# Patient Record
Sex: Female | Born: 1980 | Race: Black or African American | Hispanic: No | Marital: Single | Smoking: Never smoker
Health system: Southern US, Community
[De-identification: ages and names within clinical notes are randomized; demographics above are authoritative.]

## PROBLEM LIST (undated history)

## (undated) ENCOUNTER — Inpatient Hospital Stay (HOSPITAL_COMMUNITY): Payer: Self-pay

## (undated) DIAGNOSIS — F419 Anxiety disorder, unspecified: Secondary | ICD-10-CM

## (undated) HISTORY — DX: Anxiety disorder, unspecified: F41.9

---

## 2003-09-23 ENCOUNTER — Other Ambulatory Visit: Admission: RE | Admit: 2003-09-23 | Discharge: 2003-09-23 | Payer: Self-pay | Admitting: Family Medicine

## 2004-11-09 ENCOUNTER — Other Ambulatory Visit: Admission: RE | Admit: 2004-11-09 | Discharge: 2004-11-09 | Payer: Self-pay | Admitting: Family Medicine

## 2006-05-14 HISTORY — PX: WISDOM TOOTH EXTRACTION: SHX21

## 2006-11-05 ENCOUNTER — Other Ambulatory Visit: Admission: RE | Admit: 2006-11-05 | Discharge: 2006-11-05 | Payer: Self-pay | Admitting: Family Medicine

## 2008-11-08 ENCOUNTER — Other Ambulatory Visit: Admission: RE | Admit: 2008-11-08 | Discharge: 2008-11-08 | Payer: Self-pay | Admitting: Family Medicine

## 2012-11-10 LAB — OB RESULTS CONSOLE PLATELET COUNT: Platelets: 235 10*3/uL

## 2012-11-10 LAB — OB RESULTS CONSOLE ANTIBODY SCREEN: Antibody Screen: NEGATIVE

## 2012-11-10 LAB — OB RESULTS CONSOLE RPR: RPR: NONREACTIVE

## 2012-11-10 LAB — OB RESULTS CONSOLE ABO/RH: RH Type: POSITIVE

## 2012-11-10 LAB — OB RESULTS CONSOLE HEPATITIS B SURFACE ANTIGEN: Hepatitis B Surface Ag: NEGATIVE

## 2012-11-10 LAB — OB RESULTS CONSOLE HGB/HCT, BLOOD: Hemoglobin: 12.5 g/dL

## 2013-02-18 ENCOUNTER — Telehealth: Payer: Self-pay | Admitting: General Practice

## 2013-02-18 ENCOUNTER — Encounter: Payer: Self-pay | Admitting: *Deleted

## 2013-02-18 NOTE — Telephone Encounter (Signed)
Patient called and left message stating she needs to speak with a nurse, she has an issue with the front desk in regards to scheduling. Called patient stating I was returning her phone call. Patient stated that she was seen at her doctors office in [place] on 9/24 and had an ultrasound done and was told she needed a follow up ultrasound in 2 weeks because they couldn't visualize the baby's heart well and her new appt with [place] isn't until the 30th and that appt isn't for an ultrasound. Told patient we could see her tomorrow for a new OB appt at 7:45 and she can mention this to the provider tomorrow now that we have her records and they can discuss with her if she needs another ultrasound. Patient verbalized understanding and stated she would be here. Patient informed if she couldn't make this appt she would have to wait till 10/30. Patient verbalized understanding to all and had no further questions

## 2013-02-19 ENCOUNTER — Ambulatory Visit (INDEPENDENT_AMBULATORY_CARE_PROVIDER_SITE_OTHER): Payer: Managed Care, Other (non HMO) | Admitting: Family Medicine

## 2013-02-19 ENCOUNTER — Encounter: Payer: Self-pay | Admitting: Family Medicine

## 2013-02-19 VITALS — BP 117/73 | Temp 97.0°F | Ht 68.0 in | Wt 282.1 lb

## 2013-02-19 DIAGNOSIS — Z23 Encounter for immunization: Secondary | ICD-10-CM

## 2013-02-19 DIAGNOSIS — Z3400 Encounter for supervision of normal first pregnancy, unspecified trimester: Secondary | ICD-10-CM | POA: Insufficient documentation

## 2013-02-19 DIAGNOSIS — Z3402 Encounter for supervision of normal first pregnancy, second trimester: Secondary | ICD-10-CM

## 2013-02-19 LAB — POCT URINALYSIS DIP (DEVICE)
Bilirubin Urine: NEGATIVE
Ketones, ur: NEGATIVE mg/dL
Leukocytes, UA: NEGATIVE
Nitrite: NEGATIVE
Protein, ur: NEGATIVE mg/dL

## 2013-02-19 NOTE — Addendum Note (Signed)
Addended by: Franchot Mimes on: 02/19/2013 02:07 PM   Modules accepted: Orders

## 2013-02-19 NOTE — Progress Notes (Signed)
U/S scheduled 02/26/13 at 8 am.

## 2013-02-19 NOTE — Progress Notes (Signed)
P=104, Here for first ob visit. Transferring from [facility]. Given new patient information. Discussed BMI/ appropriate weight gain.

## 2013-02-19 NOTE — Progress Notes (Signed)
New OB transfer from [place]--[facility] WNL including low risk first screen--needs to complete anatomy-flu shot today.

## 2013-02-19 NOTE — Patient Instructions (Signed)
Pregnancy - Second Trimester The second trimester of pregnancy (3 to 6 months) is a period of rapid growth for you and your baby. At the end of the sixth month, your baby is about 9 inches long and weighs 1 1/2 pounds. You will begin to feel the baby move between 18 and 20 weeks of the pregnancy. This is called quickening. Weight gain is faster. A clear fluid (colostrum) may leak out of your breasts. You may feel small contractions of the womb (uterus). This is known as false labor or Braxton-Hicks contractions. This is like a practice for labor when the baby is ready to be born. Usually, the problems with morning sickness have usually passed by the end of your first trimester. Some women develop small dark blotches (called cholasma, mask of pregnancy) on their face that usually goes away after the baby is born. Exposure to the sun makes the blotches worse. Acne may also develop in some pregnant women and pregnant women who have acne, may find that it goes away. PRENATAL EXAMS  Blood work may continue to be done during prenatal exams. These tests are done to check on your health and the probable health of your baby. Blood work is used to follow your blood levels (hemoglobin). Anemia (low hemoglobin) is common during pregnancy. Iron and vitamins are given to help prevent this. You will also be checked for diabetes between 24 and 28 weeks of the pregnancy. Some of the previous blood tests may be repeated.  The size of the uterus is measured during each visit. This is to make sure that the baby is continuing to grow properly according to the dates of the pregnancy.  Your blood pressure is checked every prenatal visit. This is to make sure you are not getting toxemia.  Your urine is checked to make sure you do not have an infection, diabetes or protein in the urine.  Your weight is checked often to make sure gains are happening at the suggested rate. This is to ensure that both you and your baby are  growing normally.  Sometimes, an ultrasound is performed to confirm the proper growth and development of the baby. This is a test which bounces harmless sound waves off the baby so your caregiver can more accurately determine due dates. Sometimes, a test is done on the amniotic fluid surrounding the baby. This test is called an amniocentesis. The amniotic fluid is obtained by sticking a needle into the belly (abdomen). This is done to check the chromosomes in instances where there is a concern about possible genetic problems with the baby. It is also sometimes done near the end of pregnancy if an early delivery is required. In this case, it is done to help make sure the baby's lungs are mature enough for the baby to live outside of the womb. CHANGES OCCURING IN THE SECOND TRIMESTER OF PREGNANCY Your body goes through many changes during pregnancy. They vary from person to person. Talk to your caregiver about changes you notice that you are concerned about.  During the second trimester, you will likely have an increase in your appetite. It is normal to have cravings for certain foods. This varies from person to person and pregnancy to pregnancy.  Your lower abdomen will begin to bulge.  You may have to urinate more often because the uterus and baby are pressing on your bladder. It is also common to get more bladder infections during pregnancy. You can help this by drinking lots of fluids   and emptying your bladder before and after intercourse.  You may begin to get stretch marks on your hips, abdomen, and breasts. These are normal changes in the body during pregnancy. There are no exercises or medicines to take that prevent this change.  You may begin to develop swollen and bulging veins (varicose veins) in your legs. Wearing support hose, elevating your feet for 15 minutes, 3 to 4 times a day and limiting salt in your diet helps lessen the problem.  Heartburn may develop as the uterus grows and  pushes up against the stomach. Antacids recommended by your caregiver helps with this problem. Also, eating smaller meals 4 to 5 times a day helps.  Constipation can be treated with a stool softener or adding bulk to your diet. Drinking lots of fluids, and eating vegetables, fruits, and whole grains are helpful.  Exercising is also helpful. If you have been very active up until your pregnancy, most of these activities can be continued during your pregnancy. If you have been less active, it is helpful to start an exercise program such as walking.  Hemorrhoids may develop at the end of the second trimester. Warm sitz baths and hemorrhoid cream recommended by your caregiver helps hemorrhoid problems.  Backaches may develop during this time of your pregnancy. Avoid heavy lifting, wear low heal shoes, and practice good posture to help with backache problems.  Some pregnant women develop tingling and numbness of their hand and fingers because of swelling and tightening of ligaments in the wrist (carpel tunnel syndrome). This goes away after the baby is born.  As your breasts enlarge, you may have to get a bigger bra. Get a comfortable, cotton, support bra. Do not get a nursing bra until the last month of the pregnancy if you will be nursing the baby.  You may get a dark line from your belly button to the pubic area called the linea nigra.  You may develop rosy cheeks because of increase blood flow to the face.  You may develop spider looking lines of the face, neck, arms, and chest. These go away after the baby is born. HOME CARE INSTRUCTIONS   It is extremely important to avoid all smoking, herbs, alcohol, and unprescribed drugs during your pregnancy. These chemicals affect the formation and growth of the baby. Avoid these chemicals throughout the pregnancy to ensure the delivery of a healthy infant.  Most of your home care instructions are the same as suggested for the first trimester of your  pregnancy. Keep your caregiver's appointments. Follow your caregiver's instructions regarding medicine use, exercise, and diet.  During pregnancy, you are providing food for you and your baby. Continue to eat regular, well-balanced meals. Choose foods such as meat, fish, milk and other low fat dairy products, vegetables, fruits, and whole-grain breads and cereals. Your caregiver will tell you of the ideal weight gain.  A physical sexual relationship may be continued up until near the end of pregnancy if there are no other problems. Problems could include early (premature) leaking of amniotic fluid from the membranes, vaginal bleeding, abdominal pain, or other medical or pregnancy problems.  Exercise regularly if there are no restrictions. Check with your caregiver if you are unsure of the safety of some of your exercises. The greatest weight gain will occur in the last 2 trimesters of pregnancy. Exercise will help you:  Control your weight.  Get you in shape for labor and delivery.  Lose weight after you have the baby.  Wear   a good support or jogging bra for breast tenderness during pregnancy. This may help if worn during sleep. Pads or tissues may be used in the bra if you are leaking colostrum.  Do not use hot tubs, steam rooms or saunas throughout the pregnancy.  Wear your seat belt at all times when driving. This protects you and your baby if you are in an accident.  Avoid raw meat, uncooked cheese, cat litter boxes, and soil used by cats. These carry germs that can cause birth defects in the baby.  The second trimester is also a good time to visit your dentist for your dental health if this has not been done yet. Getting your teeth cleaned is okay. Use a soft toothbrush. Brush gently during pregnancy.  It is easier to leak urine during pregnancy. Tightening up and strengthening the pelvic muscles will help with this problem. Practice stopping your urination while you are going to the  bathroom. These are the same muscles you need to strengthen. It is also the muscles you would use as if you were trying to stop from passing gas. You can practice tightening these muscles up 10 times a set and repeating this about 3 times per day. Once you know what muscles to tighten up, do not perform these exercises during urination. It is more likely to contribute to an infection by backing up the urine.  Ask for help if you have financial, counseling, or nutritional needs during pregnancy. Your caregiver will be able to offer counseling for these needs as well as refer you for other special needs.  Your skin may become oily. If so, wash your face with mild soap, use non-greasy moisturizer and oil or cream based makeup. MEDICINES AND DRUG USE IN PREGNANCY  Take prenatal vitamins as directed. The vitamin should contain 1 milligram of folic acid. Keep all vitamins out of reach of children. Only a couple vitamins or tablets containing iron may be fatal to a baby or young child when ingested.  Avoid use of all medicines, including herbs, over-the-counter medicines, not prescribed or suggested by your caregiver. Only take over-the-counter or prescription medicines for pain, discomfort, or fever as directed by your caregiver. Do not use aspirin.  Let your caregiver also know about herbs you may be using.  Alcohol is related to a number of birth defects. This includes fetal alcohol syndrome. All alcohol, in any form, should be avoided completely. Smoking will cause low birth rate and premature babies.  Street or illegal drugs are very harmful to the baby. They are absolutely forbidden. A baby born to an addicted mother will be addicted at birth. The baby will go through the same withdrawal an adult does. SEEK MEDICAL CARE IF:  You have any concerns or worries during your pregnancy. It is better to call with your questions if you feel they cannot wait, rather than worry about them. SEEK IMMEDIATE  MEDICAL CARE IF:   An unexplained oral temperature above 102 F (38.9 C) develops, or as your caregiver suggests.  You have leaking of fluid from the vagina (birth canal). If leaking membranes are suspected, take your temperature and tell your caregiver of this when you call.  There is vaginal spotting, bleeding, or passing clots. Tell your caregiver of the amount and how many pads are used. Light spotting in pregnancy is common, especially following intercourse.  You develop a bad smelling vaginal discharge with a change in the color from clear to white.  You continue to feel   sick to your stomach (nauseated) and have no relief from remedies suggested. You vomit blood or coffee ground-like materials.  You lose more than 2 pounds of weight or gain more than 2 pounds of weight over 1 week, or as suggested by your caregiver.  You notice swelling of your face, hands, feet, or legs.  You get exposed to German measles and have never had them.  You are exposed to fifth disease or chickenpox.  You develop belly (abdominal) pain. Round ligament discomfort is a common non-cancerous (benign) cause of abdominal pain in pregnancy. Your caregiver still must evaluate you.  You develop a bad headache that does not go away.  You develop fever, diarrhea, pain with urination, or shortness of breath.  You develop visual problems, blurry, or double vision.  You fall or are in a car accident or any kind of trauma.  There is mental or physical violence at home. Document Released: 04/24/2001 Document Revised: 01/23/2012 Document Reviewed: 10/27/2008 [employer] Patient Information 2014 [employer].  Breastfeeding A change in hormones during your pregnancy causes growth of your breast tissue and an increase in number and size of milk ducts. The hormone prolactin allows proteins, sugars, and fats from your blood supply to make breast milk in your milk-producing glands. The hormone progesterone prevents  breast milk from being released before the birth of your baby. After the birth of your baby, your progesterone level decreases allowing breast milk to be released. Thoughts of your baby, as well as his or her sucking or crying, can stimulate the release of milk from the milk-producing glands. Deciding to breastfeed (nurse) is one of the best choices you can make for you and your baby. The information that follows gives a brief review of the benefits, as well as other important skills to know about breastfeeding. BENEFITS OF BREASTFEEDING For your baby  The first milk (colostrum) helps your baby's digestive system function better.   There are antibodies in your milk that help your baby fight off infections.   Your baby has a lower incidence of asthma, allergies, and sudden infant death syndrome (SIDS).   The nutrients in breast milk are better for your baby than infant formulas.  Breast milk improves your baby's brain development.   Your baby will have less gas, colic, and constipation.  Your baby is less likely to develop other conditions, such as childhood obesity, asthma, or diabetes mellitus. For you  Breastfeeding helps develop a very special bond between you and your baby.   Breastfeeding is convenient, always available at the correct temperature, and costs nothing.   Breastfeeding helps to burn calories and helps you lose the weight gained during pregnancy.   Breastfeeding makes your uterus contract back down to normal size faster and slows bleeding following delivery.   Breastfeeding mothers have a lower risk of developing osteoporosis or breast or ovarian cancer later in life.  BREASTFEEDING FREQUENCY  A healthy, full-term baby may breastfeed as often as every hour or space his or her feedings to every 3 hours. Breastfeeding frequency will vary from baby to baby.   Newborns should be fed no less than every 2 3 hours during the day and every 4 5 hours during the  night. You should breastfeed a minimum of 8 feedings in a 24 hour period.  Awaken your baby to breastfeed if it has been 3 4 hours since the last feeding.  Breastfeed when you feel the need to reduce the fullness of your breasts or when   your newborn shows signs of hunger. Signs that your baby may be hungry include:  Increased alertness or activity.  Stretching.  Movement of the head from side to side.  Movement of the head and opening of the mouth when the corner of the mouth or cheek is stroked (rooting).  Increased sucking sounds, smacking lips, cooing, sighing, or squeaking.  Hand-to-mouth movements.  Increased sucking of fingers or hands.  Fussing.  Intermittent crying.  Signs of extreme hunger will require calming and consoling before you try to feed your baby. Signs of extreme hunger may include:  Restlessness.  A loud, strong cry.  Screaming.  Frequent feeding will help you make more milk and will help prevent problems, such as sore nipples and engorgement of the breasts.  BREASTFEEDING   Whether lying down or sitting, be sure that the baby's abdomen is facing your abdomen.   Support your breast with 4 fingers under your breast and your thumb above your nipple. Make sure your fingers are well away from your nipple and your baby's mouth.   Stroke your baby's lips gently with your finger or nipple.   When your baby's mouth is open wide enough, place all of your nipple and as much of the colored area around your nipple (areola) as possible into your baby's mouth.  More areola should be visible above his or her upper lip than below his or her lower lip.  Your baby's tongue should be between his or her lower gum and your breast.  Ensure that your baby's mouth is correctly positioned around the nipple (latched). Your baby's lips should create a seal on your breast.  Signs that your baby has effectively latched onto your nipple include:  Tugging or sucking  without pain.  Swallowing heard between sucks.  Absent click or smacking sound.  Muscle movement above and in front of his or her ears with sucking.  Your baby must suck about 2 3 minutes in order to get your milk. Allow your baby to feed on each breast as long as he or she wants. Nurse your baby until he or she unlatches or falls asleep at the first breast, then offer the second breast.  Signs that your baby is full and satisfied include:  A gradual decrease in the number of sucks or complete cessation of sucking.  Falling asleep.  Extension or relaxation of his or her body.  Retention of a small amount of milk in his or her mouth.  Letting go of your breast by himself or herself.  Signs of effective breastfeeding in you include:  Breasts that have increased firmness, weight, and size prior to feeding.  Breasts that are softer after nursing.  Increased milk volume, as well as a change in milk consistency and color by the 5th day of breastfeeding.  Breast fullness relieved by breastfeeding.  Nipples are not sore, cracked, or bleeding.  If needed, break the suction by putting your finger into the corner of your baby's mouth and sliding your finger between his or her gums. Then, remove your breast from his or her mouth.  It is common for babies to spit up a small amount after a feeding.  Babies often swallow air during feeding. This can make babies fussy. Burping your baby between breasts can help with this.  Vitamin D supplements are recommended for babies who get only breast milk.  Avoid using a pacifier during your baby's first 4 6 weeks.  Avoid supplemental feedings of water, formula, or   juice in place of breastfeeding. Breast milk is all the food your baby needs. It is not necessary for your baby to have water or formula. Your breasts will make more milk if supplemental feedings are avoided during the early weeks. HOW TO TELL WHETHER YOUR BABY IS GETTING ENOUGH BREAST  MILK Wondering whether or not your baby is getting enough milk is a common concern among mothers. You can be assured that your baby is getting enough milk if:   Your baby is actively sucking and you hear swallowing.   Your baby seems relaxed and satisfied after a feeding.   Your baby nurses at least 8 12 times in a 24 hour time period.  During the first 3 5 days of age:  Your baby is wetting at least 3 5 diapers in a 24 hour period. The urine should be clear and pale yellow.  Your baby is having at least 3 4 stools in a 24 hour period. The stool should be soft and yellow.  At 5 7 days of age, your baby is having at least 3 6 stools in a 24 hour period. The stool should be seedy and yellow by 5 days of age.  Your baby has a weight loss less than 7 10% during the first 3 days of age.  Your baby does not lose weight after 3 7 days of age.  Your baby gains 4 7 ounces each week after he or she is 4 days of age.  Your baby gains weight by 5 days of age and is back to birth weight within 2 weeks. ENGORGEMENT In the first week after your baby is born, you may experience extremely full breasts (engorgement). When engorged, your breasts may feel heavy, warm, or tender to the touch. Engorgement peaks within 24 48 hours after delivery of your baby.  Engorgement may be reduced by:  Continuing to breastfeed.  Increasing the frequency of breastfeeding.  Taking warm showers or applying warm, moist heat to your breasts just before each feeding. This increases circulation and helps the milk flow.   Gently massaging your breast before and during the feedings. With your fingertips, massage from your chest wall towards your nipple in a circular motion.   Ensuring that your baby empties at least one breast at every feeding. It also helps to start the next feeding on the opposite breast.   Expressing breast milk by hand or by using a breast pump to empty the breasts if your baby is sleepy, or  not nursing well. You may also want to express milk if you are returning to work oryou feel you are getting engorged.  Ensuring your baby is latched on and positioned properly while breastfeeding. If you follow these suggestions, your engorgement should improve in 24 48 hours. If you are still experiencing difficulty, call your lactation consultant or caregiver.  CARING FOR YOURSELF Take care of your breasts.  Bathe or shower daily.   Avoid using soap on your nipples.   Wear a supportive bra. Avoid wearing underwire style bras.  Air dry your nipples for a 3 4minutes after each feeding.   Use only cotton bra pads to absorb breast milk leakage. Leaking of breast milk between feedings is normal.   Use only pure lanolin on your nipples after nursing. You do not need to wash it off before feeding your baby again. Another option is to express a few drops of breast milk and gently massage that milk into your nipples.  Continue   breast self-awareness checks. Take care of yourself.  Eat healthy foods. Alternate 3 meals with 3 snacks.  Avoid foods that you notice affect your baby in a bad way.  Drink milk, fruit juice, and water to satisfy your thirst (about 8 glasses a day).   Rest often, relax, and take your prenatal vitamins to prevent fatigue, stress, and anemia.  Avoid chewing and smoking tobacco.  Avoid alcohol and drug use.  Take over-the-counter and prescribed medicine only as directed by your caregiver or pharmacist. You should always check with your caregiver or pharmacist before taking any new medicine, vitamin, or herbal supplement.  Know that pregnancy is possible while breastfeeding. If desired, talk to your caregiver about family planning and safe birth control methods that may be used while breastfeeding. SEEK MEDICAL CARE IF:   You feel like you want to stop breastfeeding or have become frustrated with breastfeeding.  You have painful breasts or nipples.  Your  nipples are cracked or bleeding.  Your breasts are red, tender, or warm.  You have a swollen area on either breast.  You have a fever or chills.  You have nausea or vomiting.  You have drainage from your nipples.  Your breasts do not become full before feedings by the 5th day after delivery.  You feel sad and depressed.  Your baby is too sleepy to eat well.  Your baby is having trouble sleeping.   Your baby is wetting less than 3 diapers in a 24 hour period.  Your baby has less than 3 stools in a 24 hour period.  Your baby's skin or the white part of his or her eyes becomes more yellow.   Your baby is not gaining weight by 5 days of age. MAKE SURE YOU:   Understand these instructions.  Will watch your condition.  Will get help right away if you are not doing well or get worse. Document Released: 04/30/2005 Document Revised: 01/23/2012 Document Reviewed: 12/05/2011 [employer] Patient Information 2014 [employer].  

## 2013-02-20 LAB — PRESCRIPTION MONITORING PROFILE (19 PANEL)
Amphetamine/Meth: NEGATIVE ng/mL
Buprenorphine, Urine: NEGATIVE ng/mL
Cannabinoid Scrn, Ur: NEGATIVE ng/mL
Carisoprodol, Urine: NEGATIVE ng/mL
Cocaine Metabolites: NEGATIVE ng/mL
Creatinine, Urine: 139.31 mg/dL (ref >20.0)
MDMA URINE: NEGATIVE ng/mL
Meperidine, Ur: NEGATIVE ng/mL
Methadone Screen, Urine: NEGATIVE ng/mL
Methaqualone: NEGATIVE ng/mL
Nitrites, Initial: NEGATIVE ug/mL
Oxycodone Screen, Ur: NEGATIVE ng/mL
Phencyclidine, Ur: NEGATIVE ng/mL
Propoxyphene: NEGATIVE ng/mL
Tapentadol, urine: NEGATIVE ng/mL
Tramadol Scrn, Ur: NEGATIVE ng/mL

## 2013-02-20 LAB — ALCOHOL METABOLITE (ETG), URINE: Ethyl Glucuronide (EtG): NEGATIVE ng/mL

## 2013-02-26 ENCOUNTER — Ambulatory Visit (HOSPITAL_COMMUNITY): Admission: RE | Admit: 2013-02-26 | Payer: Managed Care, Other (non HMO) | Source: Ambulatory Visit

## 2013-02-26 ENCOUNTER — Other Ambulatory Visit: Payer: Self-pay | Admitting: Family Medicine

## 2013-02-26 ENCOUNTER — Ambulatory Visit (HOSPITAL_COMMUNITY)
Admission: RE | Admit: 2013-02-26 | Discharge: 2013-02-26 | Disposition: A | Discharge status: Home / Self Care | Payer: Managed Care, Other (non HMO) | Source: Ambulatory Visit | Attending: Family Medicine | Admitting: Family Medicine

## 2013-02-26 DIAGNOSIS — Z3402 Encounter for supervision of normal first pregnancy, second trimester: Secondary | ICD-10-CM

## 2013-02-26 DIAGNOSIS — Z363 Encounter for antenatal screening for malformations: Secondary | ICD-10-CM | POA: Insufficient documentation

## 2013-02-26 DIAGNOSIS — E669 Obesity, unspecified: Secondary | ICD-10-CM | POA: Insufficient documentation

## 2013-02-26 DIAGNOSIS — O358XX0 Maternal care for other (suspected) fetal abnormality and damage, not applicable or unspecified: Secondary | ICD-10-CM | POA: Insufficient documentation

## 2013-02-26 DIAGNOSIS — Z1389 Encounter for screening for other disorder: Secondary | ICD-10-CM | POA: Insufficient documentation

## 2013-02-27 ENCOUNTER — Encounter: Payer: Self-pay | Admitting: Family Medicine

## 2013-03-04 ENCOUNTER — Encounter: Payer: Self-pay | Admitting: *Deleted

## 2013-03-04 DIAGNOSIS — O99340 Other mental disorders complicating pregnancy, unspecified trimester: Secondary | ICD-10-CM | POA: Insufficient documentation

## 2013-03-12 ENCOUNTER — Encounter: Payer: Self-pay | Admitting: Obstetrics & Gynecology

## 2013-03-20 ENCOUNTER — Ambulatory Visit (INDEPENDENT_AMBULATORY_CARE_PROVIDER_SITE_OTHER): Payer: 59 | Admitting: Family Medicine

## 2013-03-20 ENCOUNTER — Encounter: Payer: Self-pay | Admitting: Family Medicine

## 2013-03-20 VITALS — Temp 98.2°F

## 2013-03-20 DIAGNOSIS — Z3402 Encounter for supervision of normal first pregnancy, second trimester: Secondary | ICD-10-CM

## 2013-03-20 DIAGNOSIS — O99340 Other mental disorders complicating pregnancy, unspecified trimester: Secondary | ICD-10-CM

## 2013-03-20 LAB — POCT URINALYSIS DIP (DEVICE)
Bilirubin Urine: NEGATIVE
Hgb urine dipstick: NEGATIVE
Leukocytes, UA: NEGATIVE
Nitrite: NEGATIVE
Urobilinogen, UA: 0.2 mg/dL (ref 0.0–1.0)
pH: 6 (ref 5.0–8.0)

## 2013-03-20 LAB — CBC
HCT: 32.5 % — ABNORMAL LOW (ref 36.0–46.0)
Hemoglobin: 10.9 g/dL — ABNORMAL LOW (ref 12.0–15.0)
MCH: 27.6 pg (ref 26.0–34.0)
MCHC: 33.5 g/dL (ref 30.0–36.0)
MCV: 82.3 fL (ref 78.0–100.0)
RBC: 3.95 MIL/uL (ref 3.87–5.11)
WBC: 9 10*3/uL (ref 4.0–10.5)

## 2013-03-20 MED ORDER — BREAST PUMP MISC: 1.0000 [IU] | Status: DC

## 2013-03-20 NOTE — Progress Notes (Signed)
[name] is a 32 y.o. G1P0 at [redacted]w[redacted]d here for ROB visit. No VB, no ctx, no lof, normal fm  Round ligament pain at night. mild  Discussed with Patient:  - Plans to breast/ feed.  All questions answered. - Continue prenatal vitamins. - Reviewed fetal kick counts (Pt to perform daily at a time when the baby is active, lie laterally with both hands on belly in quiet room and count all movements (hiccups, shoulder rolls, obvious kicks, etc); pt is to report to clinic or MAU for less than 10 movements felt in a one hour time period-pt told as soon as she counts 10 movements the count is complete.)  - Routine precautions discussed (depression, infection s/s).   Patient provided with all pertinent phone numbers for emergencies. - RTC for any VB, regular, painful cramps/ctxs occurring at a rate of >2/10 min, fever (100.5 or higher), n/v/d, any pain that is unresolving or worsening, LOF, decreased fetal movement, CP, SOB, edema  Problems: Patient Active Problem List   Diagnosis Date Noted  . 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 Supervision of normal first pregnancy 02/19/2013    To Do: 1. Glucose tolerance test ordered.  Patient will draw in clinic.  Will f/u test and amend plan based on results. 2. CBC and antibody screen ordered. 3.   [ ]  Vaccines: Flu: recd Tdap:  [ ]  BCM: undecided  Edu: [x ] PTL precautions; [ ]  BF class; [ ]  childbirth class; [ ]   BF counseling;

## 2013-03-20 NOTE — Addendum Note (Signed)
Addended by: Franchot Mimes on: 03/20/2013 10:54 AM   Modules accepted: Orders

## 2013-03-20 NOTE — Patient Instructions (Signed)
Second Trimester of Pregnancy The second trimester is from week 13 through week 28, months 4 through 6. The second trimester is often a time when you feel your best. Your body has also adjusted to being pregnant, and you begin to feel better physically. Usually, morning sickness has lessened or quit completely, you may have more energy, and you may have an increase in appetite. The second trimester is also a time when the fetus is growing rapidly. At the end of the sixth month, the fetus is about 9 inches long and weighs about 1 pounds. You will likely begin to feel the baby move (quickening) between 18 and 20 weeks of the pregnancy. BODY CHANGES Your body goes through many changes during pregnancy. The changes vary from woman to woman.   Your weight will continue to increase. You will notice your lower abdomen bulging out.  You may begin to get stretch marks on your hips, abdomen, and breasts.  You may develop headaches that can be relieved by medicines approved by your caregiver.  You may urinate more often because the fetus is pressing on your bladder.  You may develop or continue to have heartburn as a result of your pregnancy.  You may develop constipation because certain hormones are causing the muscles that push waste through your intestines to slow down.  You may develop hemorrhoids or swollen, bulging veins (varicose veins).  You may have back pain because of the weight gain and pregnancy hormones relaxing your joints between the bones in your pelvis and as a result of a shift in weight and the muscles that support your balance.  Your breasts will continue to grow and be tender.  Your gums may bleed and may be sensitive to brushing and flossing.  Dark spots or blotches (chloasma, mask of pregnancy) may develop on your face. This will likely fade after the baby is born.  A dark line from your belly button to the pubic area (linea nigra) may appear. This will likely fade after the  baby is born. WHAT TO EXPECT AT YOUR PRENATAL VISITS During a routine prenatal visit:  You will be weighed to make sure you and the fetus are growing normally.  Your blood pressure will be taken.  Your abdomen will be measured to track your baby's growth.  The fetal heartbeat will be listened to.  Any test results from the previous visit will be discussed. Your caregiver may ask you:  How you are feeling.  If you are feeling the baby move.  If you have had any abnormal symptoms, such as leaking fluid, bleeding, severe headaches, or abdominal cramping.  If you have any questions. Other tests that may be performed during your second trimester include:  Blood tests that check for:  Low iron levels (anemia).  Gestational diabetes (between 24 and 28 weeks).  Rh antibodies.  Urine tests to check for infections, diabetes, or protein in the urine.  An ultrasound to confirm the proper growth and development of the baby.  An amniocentesis to check for possible genetic problems.  Fetal screens for spina bifida and Down syndrome. HOME CARE INSTRUCTIONS   Avoid all smoking, herbs, alcohol, and unprescribed drugs. These chemicals affect the formation and growth of the baby.  Follow your caregiver's instructions regarding medicine use. There are medicines that are either safe or unsafe to take during pregnancy.  Exercise only as directed by your caregiver. Experiencing uterine cramps is a good sign to stop exercising.  Continue to eat regular,   healthy meals.  Wear a good support bra for breast tenderness.  Do not use hot tubs, steam rooms, or saunas.  Wear your seat belt at all times when driving.  Avoid raw meat, uncooked cheese, cat litter boxes, and soil used by cats. These carry germs that can cause birth defects in the baby.  Take your prenatal vitamins.  Try taking a stool softener (if your caregiver approves) if you develop constipation. Eat more high-fiber foods,  such as fresh vegetables or fruit and whole grains. Drink plenty of fluids to keep your urine clear or pale yellow.  Take warm sitz baths to soothe any pain or discomfort caused by hemorrhoids. Use hemorrhoid cream if your caregiver approves.  If you develop varicose veins, wear support hose. Elevate your feet for 15 minutes, 3 4 times a day. Limit salt in your diet.  Avoid heavy lifting, wear low heel shoes, and practice good posture.  Rest with your legs elevated if you have leg cramps or low back pain.  Visit your dentist if you have not gone yet during your pregnancy. Use a soft toothbrush to brush your teeth and be gentle when you floss.  A sexual relationship may be continued unless your caregiver directs you otherwise.  Continue to go to all your prenatal visits as directed by your caregiver. SEEK MEDICAL CARE IF:   You have dizziness.  You have mild pelvic cramps, pelvic pressure, or nagging pain in the abdominal area.  You have persistent nausea, vomiting, or diarrhea.  You have a bad smelling vaginal discharge.  You have pain with urination. SEEK IMMEDIATE MEDICAL CARE IF:   You have a fever.  You are leaking fluid from your vagina.  You have spotting or bleeding from your vagina.  You have severe abdominal cramping or pain.  You have rapid weight gain or loss.  You have shortness of breath with chest pain.  You notice sudden or extreme swelling of your face, hands, ankles, feet, or legs.  You have not felt your baby move in over an hour.  You have severe headaches that do not go away with medicine.  You have vision changes. Document Released: 04/24/2001 Document Revised: 12/31/2012 Document Reviewed: 07/01/2012 [employer] Patient Information 2014 [employer].  

## 2013-03-20 NOTE — Progress Notes (Signed)
P= 86 Pt. C/o of lower abdominal/pelvic/vaginal pressure and "pulling."

## 2013-03-21 LAB — GLUCOSE TOLERANCE, 1 HOUR (50G) W/O FASTING: Glucose, 1 Hour GTT: 118 mg/dL (ref 70–140)

## 2013-03-24 ENCOUNTER — Ambulatory Visit (HOSPITAL_COMMUNITY)
Admission: RE | Admit: 2013-03-24 | Discharge: 2013-03-24 | Disposition: A | Discharge status: Home / Self Care | Payer: Managed Care, Other (non HMO) | Source: Ambulatory Visit | Attending: Family Medicine | Admitting: Family Medicine

## 2013-03-24 ENCOUNTER — Ambulatory Visit (HOSPITAL_COMMUNITY): Admission: RE | Admit: 2013-03-24 | Payer: Managed Care, Other (non HMO) | Source: Ambulatory Visit

## 2013-03-24 DIAGNOSIS — E669 Obesity, unspecified: Secondary | ICD-10-CM | POA: Insufficient documentation

## 2013-03-24 DIAGNOSIS — Z3689 Encounter for other specified antenatal screening: Secondary | ICD-10-CM | POA: Insufficient documentation

## 2013-03-24 DIAGNOSIS — Z3402 Encounter for supervision of normal first pregnancy, second trimester: Secondary | ICD-10-CM

## 2013-03-25 ENCOUNTER — Encounter: Payer: Self-pay | Admitting: Family Medicine

## 2013-04-15 ENCOUNTER — Encounter: Payer: Self-pay | Admitting: Advanced Practice Midwife

## 2013-04-15 ENCOUNTER — Ambulatory Visit (INDEPENDENT_AMBULATORY_CARE_PROVIDER_SITE_OTHER): Payer: Managed Care, Other (non HMO) | Admitting: Advanced Practice Midwife

## 2013-04-15 VITALS — BP 116/72 | Temp 97.3°F | Wt 282.6 lb

## 2013-04-15 DIAGNOSIS — Z23 Encounter for immunization: Secondary | ICD-10-CM

## 2013-04-15 DIAGNOSIS — Z3403 Encounter for supervision of normal first pregnancy, third trimester: Secondary | ICD-10-CM

## 2013-04-15 DIAGNOSIS — Z3400 Encounter for supervision of normal first pregnancy, unspecified trimester: Secondary | ICD-10-CM

## 2013-04-15 LAB — POCT URINALYSIS DIP (DEVICE)
Bilirubin Urine: NEGATIVE
Glucose, UA: NEGATIVE mg/dL
Ketones, ur: NEGATIVE mg/dL
Nitrite: NEGATIVE
Specific Gravity, Urine: >=1.03 (ref 1.005–1.030)
pH: 6 (ref 5.0–8.0)

## 2013-04-15 MED ORDER — TETANUS-DIPHTH-ACELL PERTUSSIS 5-2.5-18.5 LF-MCG/0.5 IM SUSP: 0.5000 mL | Freq: Once | INTRAMUSCULAR | Status: DC

## 2013-04-15 NOTE — Progress Notes (Signed)
P=107,  C/o pains in vaginal area.

## 2013-04-15 NOTE — Addendum Note (Signed)
Addended by: Kathee Delton on: 04/15/2013 11:56 AM   Modules accepted: Orders

## 2013-04-15 NOTE — Progress Notes (Signed)
Feels pulling sensation still. Wants cervical check. Cervix is softened and vertex is low. Cervix feels somewhat short. 3 wks ago length was 3.2cm.

## 2013-04-15 NOTE — Patient Instructions (Signed)
Third Trimester of Pregnancy  The third trimester is from week 29 through week 42, months 7 through 9. The third trimester is a time when the fetus is growing rapidly. At the end of the ninth month, the fetus is about 20 inches in length and weighs 6 10 pounds.   BODY CHANGES  Your body goes through many changes during pregnancy. The changes vary from woman to woman.    Your weight will continue to increase. You can expect to gain 25 35 pounds (11 16 kg) by the end of the pregnancy.   You may begin to get stretch marks on your hips, abdomen, and breasts.   You may urinate more often because the fetus is moving lower into your pelvis and pressing on your bladder.   You may develop or continue to have heartburn as a result of your pregnancy.   You may develop constipation because certain hormones are causing the muscles that push waste through your intestines to slow down.   You may develop hemorrhoids or swollen, bulging veins (varicose veins).   You may have pelvic pain because of the weight gain and pregnancy hormones relaxing your joints between the bones in your pelvis. Back aches may result from over exertion of the muscles supporting your posture.   Your breasts will continue to grow and be tender. A yellow discharge may leak from your breasts called colostrum.   Your belly button may stick out.   You may feel short of breath because of your expanding uterus.   You may notice the fetus "dropping," or moving lower in your abdomen.   You may have a bloody mucus discharge. This usually occurs a few days to a week before labor begins.   Your cervix becomes thin and soft (effaced) near your due date.  WHAT TO EXPECT AT YOUR PRENATAL EXAMS   You will have prenatal exams every 2 weeks until week 36. Then, you will have weekly prenatal exams. During a routine prenatal visit:   You will be weighed to make sure you and the fetus are growing normally.   Your blood pressure is taken.   Your abdomen will be  measured to track your baby's growth.   The fetal heartbeat will be listened to.   Any test results from the previous visit will be discussed.   You may have a cervical check near your due date to see if you have effaced.  At around 36 weeks, your caregiver will check your cervix. At the same time, your caregiver will also perform a test on the secretions of the vaginal tissue. This test is to determine if a type of bacteria, Group B streptococcus, is present. Your caregiver will explain this further.  Your caregiver may ask you:   What your birth plan is.   How you are feeling.   If you are feeling the baby move.   If you have had any abnormal symptoms, such as leaking fluid, bleeding, severe headaches, or abdominal cramping.   If you have any questions.  Other tests or screenings that may be performed during your third trimester include:   Blood tests that check for low iron levels (anemia).   Fetal testing to check the health, activity level, and growth of the fetus. Testing is done if you have certain medical conditions or if there are problems during the pregnancy.  FALSE LABOR  You may feel small, irregular contractions that eventually go away. These are called Braxton Hicks contractions, or   false labor. Contractions may last for hours, days, or even weeks before true labor sets in. If contractions come at regular intervals, intensify, or become painful, it is best to be seen by your caregiver.   SIGNS OF LABOR    Menstrual-like cramps.   Contractions that are 5 minutes apart or less.   Contractions that start on the top of the uterus and spread down to the lower abdomen and back.   A sense of increased pelvic pressure or back pain.   A watery or bloody mucus discharge that comes from the vagina.  If you have any of these signs before the 37th week of pregnancy, call your caregiver right away. You need to go to the hospital to get checked immediately.  HOME CARE INSTRUCTIONS    Avoid all  smoking, herbs, alcohol, and unprescribed drugs. These chemicals affect the formation and growth of the baby.   Follow your caregiver's instructions regarding medicine use. There are medicines that are either safe or unsafe to take during pregnancy.   Exercise only as directed by your caregiver. Experiencing uterine cramps is a good sign to stop exercising.   Continue to eat regular, healthy meals.   Wear a good support bra for breast tenderness.   Do not use hot tubs, steam rooms, or saunas.   Wear your seat belt at all times when driving.   Avoid raw meat, uncooked cheese, cat litter boxes, and soil used by cats. These carry germs that can cause birth defects in the baby.   Take your prenatal vitamins.   Try taking a stool softener (if your caregiver approves) if you develop constipation. Eat more high-fiber foods, such as fresh vegetables or fruit and whole grains. Drink plenty of fluids to keep your urine clear or pale yellow.   Take warm sitz baths to soothe any pain or discomfort caused by hemorrhoids. Use hemorrhoid cream if your caregiver approves.   If you develop varicose veins, wear support hose. Elevate your feet for 15 minutes, 3 4 times a day. Limit salt in your diet.   Avoid heavy lifting, wear low heal shoes, and practice good posture.   Rest a lot with your legs elevated if you have leg cramps or low back pain.   Visit your dentist if you have not gone during your pregnancy. Use a soft toothbrush to brush your teeth and be gentle when you floss.   A sexual relationship may be continued unless your caregiver directs you otherwise.   Do not travel far distances unless it is absolutely necessary and only with the approval of your caregiver.   Take prenatal classes to understand, practice, and ask questions about the labor and delivery.   Make a trial run to the hospital.   Pack your hospital bag.   Prepare the baby's nursery.   Continue to go to all your prenatal visits as directed  by your caregiver.  SEEK MEDICAL CARE IF:   You are unsure if you are in labor or if your water has broken.   You have dizziness.   You have mild pelvic cramps, pelvic pressure, or nagging pain in your abdominal area.   You have persistent nausea, vomiting, or diarrhea.   You have a bad smelling vaginal discharge.   You have pain with urination.  SEEK IMMEDIATE MEDICAL CARE IF:    You have a fever.   You are leaking fluid from your vagina.   You have spotting or bleeding from your vagina.     You have severe abdominal cramping or pain.   You have rapid weight loss or gain.   You have shortness of breath with chest pain.   You notice sudden or extreme swelling of your face, hands, ankles, feet, or legs.   You have not felt your baby move in over an hour.   You have severe headaches that do not go away with medicine.   You have vision changes.  Document Released: 04/24/2001 Document Revised: 12/31/2012 Document Reviewed: 07/01/2012  [employer] Patient Information 2014 [employer].

## 2013-04-16 ENCOUNTER — Encounter: Payer: Self-pay | Admitting: *Deleted

## 2013-04-20 ENCOUNTER — Ambulatory Visit (INDEPENDENT_AMBULATORY_CARE_PROVIDER_SITE_OTHER): Payer: Managed Care, Other (non HMO) | Admitting: Obstetrics and Gynecology

## 2013-04-20 ENCOUNTER — Ambulatory Visit (HOSPITAL_COMMUNITY)
Admission: RE | Admit: 2013-04-20 | Discharge: 2013-04-20 | Disposition: A | Discharge status: Home / Self Care | Payer: Managed Care, Other (non HMO) | Source: Ambulatory Visit | Attending: Advanced Practice Midwife | Admitting: Advanced Practice Midwife

## 2013-04-20 ENCOUNTER — Encounter: Payer: Self-pay | Admitting: Obstetrics and Gynecology

## 2013-04-20 ENCOUNTER — Other Ambulatory Visit: Payer: Self-pay | Admitting: Advanced Practice Midwife

## 2013-04-20 VITALS — BP 124/79 | Temp 98.0°F | Wt 287.7 lb

## 2013-04-20 DIAGNOSIS — O99340 Other mental disorders complicating pregnancy, unspecified trimester: Secondary | ICD-10-CM

## 2013-04-20 DIAGNOSIS — Z3403 Encounter for supervision of normal first pregnancy, third trimester: Secondary | ICD-10-CM

## 2013-04-20 DIAGNOSIS — O3430 Maternal care for cervical incompetence, unspecified trimester: Secondary | ICD-10-CM | POA: Insufficient documentation

## 2013-04-20 DIAGNOSIS — Z3689 Encounter for other specified antenatal screening: Secondary | ICD-10-CM | POA: Insufficient documentation

## 2013-04-20 MED ORDER — PROGESTERONE MICRONIZED 200 MG PO CAPS: 200.0000 mg | ORAL_CAPSULE | Freq: Every day | ORAL | Status: DC

## 2013-04-20 NOTE — Progress Notes (Signed)
Patient seen in ultrasound today to follow up on cervical length. CL noted to be 1.8 cm. Patient is otherwise asymptomatic. Will start prometrium. FM/PTL precautions reviewed. RTC next week as scheduled

## 2013-04-20 NOTE — Progress Notes (Signed)
P=106,   Was seen in Ultrasound this am and sent to clinic to see MD. Still c/o pulling pain in vagina.

## 2013-04-21 ENCOUNTER — Telehealth: Payer: Self-pay | Admitting: *Deleted

## 2013-04-21 ENCOUNTER — Telehealth: Payer: Self-pay

## 2013-04-21 NOTE — Telephone Encounter (Signed)
Called patient back and patient stated that she called earlier and was told to insert a pill in her vagina, not take it by mouth but no one gave her anything to put it in her vagina. Told patient to place the pill in her vagina with her fingers, just like tampon. Patient verbalized understanding and asked how this medication was supposed to help. Told patient that the hope is that the medication makes the muscles around the cervix stronger so that her cervix doesn't continue to shorten. Patient verbalized understanding to all and had no further questions

## 2013-04-21 NOTE — Telephone Encounter (Signed)
Pt called nurse line with question concerning a medication that she was prescribed.  Desires call back.

## 2013-04-21 NOTE — Telephone Encounter (Signed)
Pt called and stated that she had a picked up her Rx from her pharmacy for prometrium and it states to take orally but the provider said to insert vaginally.   I called pt and informed pt, per Dr. Jolayne Panther, that the pt can insert the capsule vaginally even though it says to take it orally.  I informed pt to insert like she would a tampon and that the capsule will dissolve.  Pt stated understanding with no further questions.

## 2013-04-28 ENCOUNTER — Ambulatory Visit (INDEPENDENT_AMBULATORY_CARE_PROVIDER_SITE_OTHER): Payer: Managed Care, Other (non HMO) | Admitting: Advanced Practice Midwife

## 2013-04-28 VITALS — BP 130/86 | Temp 97.1°F | Wt 284.9 lb

## 2013-04-28 DIAGNOSIS — O26873 Cervical shortening, third trimester: Secondary | ICD-10-CM

## 2013-04-28 DIAGNOSIS — O26879 Cervical shortening, unspecified trimester: Secondary | ICD-10-CM | POA: Insufficient documentation

## 2013-04-28 LAB — POCT URINALYSIS DIP (DEVICE)
Bilirubin Urine: NEGATIVE
Glucose, UA: NEGATIVE mg/dL
Hgb urine dipstick: NEGATIVE
Ketones, ur: 40 mg/dL — AB
Nitrite: NEGATIVE
Nitrite: NEGATIVE
Protein, ur: 100 mg/dL — AB
Protein, ur: 30 mg/dL — AB
Specific Gravity, Urine: 1.025 (ref 1.005–1.030)
Urobilinogen, UA: 0.2 mg/dL (ref 0.0–1.0)
Urobilinogen, UA: 0.2 mg/dL (ref 0.0–1.0)
pH: 6.5 (ref 5.0–8.0)
pH: 6.5 (ref 5.0–8.0)

## 2013-04-28 MED ORDER — CEPHALEXIN 500 MG PO CAPS: 500.0000 mg | ORAL_CAPSULE | Freq: Four times a day (QID) | ORAL | Status: AC

## 2013-04-28 NOTE — Patient Instructions (Signed)
Preterm Labor Information Preterm labor is when labor starts at less than 37 weeks of pregnancy. The normal length of a pregnancy is 39 to 41 weeks. CAUSES Often, there is no identifiable underlying cause as to why a woman goes into preterm labor. One of the most common known causes of preterm labor is infection. Infections of the uterus, cervix, vagina, amniotic sac, bladder, kidney, or even the lungs (pneumonia) can cause labor to start. Other suspected causes of preterm labor include:   Urogenital infections, such as yeast infections and bacterial vaginosis.   Uterine abnormalities (uterine shape, uterine septum, fibroids, or bleeding from the placenta).   A cervix that has been operated on (it may fail to stay closed).   Malformations in the fetus.   Multiple gestations (twins, triplets, and so on).   Breakage of the amniotic sac.  RISK FACTORS  Having a previous history of preterm labor.   Having premature rupture of membranes (PROM).   Having a placenta that covers the opening of the cervix (placenta previa).   Having a placenta that separates from the uterus (placental abruption).   Having a cervix that is too weak to hold the fetus in the uterus (incompetent cervix).   Having too much fluid in the amniotic sac (polyhydramnios).   Taking illegal drugs or smoking while pregnant.   Not gaining enough weight while pregnant.   Being younger than 18 and older than 30 years old.   Having a low socioeconomic status.   Being African American. SYMPTOMS Signs and symptoms of preterm labor include:   Menstrual-like cramps, abdominal pain, or back pain.  Uterine contractions that are regular, as frequent as six in an hour, regardless of their intensity (may be mild or painful).  Contractions that start on the top of the uterus and spread down to the lower abdomen and back.   A sense of increased pelvic pressure.   A watery or bloody mucus discharge that  comes from the vagina.  TREATMENT Depending on the length of the pregnancy and other circumstances, your health care provider may suggest bed rest. If necessary, there are medicines that can be given to stop contractions and to mature the fetal lungs. If labor happens before 34 weeks of pregnancy, a prolonged hospital stay may be recommended. Treatment depends on the condition of both you and the fetus.  WHAT SHOULD YOU DO IF YOU THINK YOU ARE IN PRETERM LABOR? Call your health care provider right away. You will need to go to the hospital to get checked immediately. HOW CAN YOU PREVENT PRETERM LABOR IN FUTURE PREGNANCIES? You should:   Stop smoking if you smoke.  Maintain healthy weight gain and avoid chemicals and drugs that are not necessary.  Be watchful for any type of infection.  Inform your health care provider if you have a known history of preterm labor. Document Released: 07/21/2003 Document Revised: 12/31/2012 Document Reviewed: 06/02/2012 [employer] Patient Information 2014 [employer].    

## 2013-04-28 NOTE — Progress Notes (Signed)
P-100 

## 2013-04-28 NOTE — Progress Notes (Signed)
Right skin abscess on breast, about 1-2cm round, no erethema, just raised with opening seen at center  Expressed small amount bloody fluid. Not tender. Will Rx Keflex and have pt wait to see if it enlarges or reddens.  No frequent contractions. Will recheck cervical length next week. PTL precautions and continue Progesterone.

## 2013-05-04 ENCOUNTER — Other Ambulatory Visit: Payer: Self-pay | Admitting: Advanced Practice Midwife

## 2013-05-04 ENCOUNTER — Ambulatory Visit (HOSPITAL_COMMUNITY)
Admission: RE | Admit: 2013-05-04 | Discharge: 2013-05-04 | Disposition: A | Discharge status: Home / Self Care | Payer: Medicaid Other | Source: Ambulatory Visit | Attending: Advanced Practice Midwife | Admitting: Advanced Practice Midwife

## 2013-05-04 DIAGNOSIS — E669 Obesity, unspecified: Secondary | ICD-10-CM | POA: Insufficient documentation

## 2013-05-04 DIAGNOSIS — O26873 Cervical shortening, third trimester: Secondary | ICD-10-CM

## 2013-05-04 DIAGNOSIS — O3430 Maternal care for cervical incompetence, unspecified trimester: Secondary | ICD-10-CM | POA: Insufficient documentation

## 2013-05-12 ENCOUNTER — Ambulatory Visit (INDEPENDENT_AMBULATORY_CARE_PROVIDER_SITE_OTHER): Payer: Medicaid Other | Admitting: Family Medicine

## 2013-05-12 VITALS — BP 136/84 | Temp 97.1°F | Wt 289.5 lb

## 2013-05-12 DIAGNOSIS — Z3403 Encounter for supervision of normal first pregnancy, third trimester: Secondary | ICD-10-CM

## 2013-05-12 DIAGNOSIS — O26879 Cervical shortening, unspecified trimester: Secondary | ICD-10-CM

## 2013-05-12 DIAGNOSIS — R030 Elevated blood-pressure reading, without diagnosis of hypertension: Secondary | ICD-10-CM

## 2013-05-12 DIAGNOSIS — O99340 Other mental disorders complicating pregnancy, unspecified trimester: Secondary | ICD-10-CM

## 2013-05-12 LAB — POCT URINALYSIS DIP (DEVICE)
Bilirubin Urine: NEGATIVE
Glucose, UA: NEGATIVE mg/dL
Hgb urine dipstick: NEGATIVE
Nitrite: NEGATIVE
Protein, ur: 100 mg/dL — AB
Urobilinogen, UA: 0.2 mg/dL (ref 0.0–1.0)

## 2013-05-12 NOTE — Progress Notes (Signed)
Pulse- 108 Patient reports vaginal pressure; reports headaches since having current cold

## 2013-05-12 NOTE — Progress Notes (Signed)
+  FM, no Lof, No vb, no ctx, constant vaginal pressure No headache, vision changes, or RUQ pain  Cold seen at urgent care - on claritin and APAP. No f/c, just cough and congestion Pt with protein in urine and borderline Bp, will collect baseline labs today, CBC, CMP and 24hr Pro  [name] is a 32 y.o. G1P0 at [redacted]w[redacted]d here for ROB visit.  Discussed with Patient:  -Plans to breast feed.  All questions answered. -Continue prenatal vitamins. -Reviewed fetal kick counts Pt to perform daily at a time when the baby is active, lie laterally with both hands on belly in quiet room and count all movements (hiccups, shoulder rolls, obvious kicks, etc); pt is to report to clinic L&D for less than 10 movements felt in a one hour time period-pt told as soon as she counts 10 movements the count is complete.  - Routine precautions discussed (depression, infection s/s).   Patient provided with all pertinent phone numbers for emergencies. - RTC for any VB, regular, painful cramps/ctxs occurring at a rate of >2/10 min, fever (100.5 or higher), n/v/d, any pain that is unresolving or worsening, LOF, decreased fetal movement, CP, SOB, edema - RTC in 2 weeks for next appt.  Problems: Patient Active Problem List   Diagnosis Date Noted  . Cervical shortening, antepartum condition or complication 04/28/2013  . 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 Supervision of normal first pregnancy 02/19/2013    To Do: 1.   [ ]  Vaccines: recd [ ]  BCM: undecided [ ]  Readiness: baby has a place to sleep, car seat, other baby necessities.  Edu: [x ] PTL precautions; [ ]  BF class; [ ]  childbirth class; [ ]   BF counseling;

## 2013-05-12 NOTE — Patient Instructions (Signed)
Third Trimester of Pregnancy  The third trimester is from week 29 through week 42, months 7 through 9. The third trimester is a time when the fetus is growing rapidly. At the end of the ninth month, the fetus is about 20 inches in length and weighs 6 10 pounds.   BODY CHANGES  Your body goes through many changes during pregnancy. The changes vary from woman to woman.    Your weight will continue to increase. You can expect to gain 25 35 pounds (11 16 kg) by the end of the pregnancy.   You may begin to get stretch marks on your hips, abdomen, and breasts.   You may urinate more often because the fetus is moving lower into your pelvis and pressing on your bladder.   You may develop or continue to have heartburn as a result of your pregnancy.   You may develop constipation because certain hormones are causing the muscles that push waste through your intestines to slow down.   You may develop hemorrhoids or swollen, bulging veins (varicose veins).   You may have pelvic pain because of the weight gain and pregnancy hormones relaxing your joints between the bones in your pelvis. Back aches may result from over exertion of the muscles supporting your posture.   Your breasts will continue to grow and be tender. A yellow discharge may leak from your breasts called colostrum.   Your belly button may stick out.   You may feel short of breath because of your expanding uterus.   You may notice the fetus "dropping," or moving lower in your abdomen.   You may have a bloody mucus discharge. This usually occurs a few days to a week before labor begins.   Your cervix becomes thin and soft (effaced) near your due date.  WHAT TO EXPECT AT YOUR PRENATAL EXAMS   You will have prenatal exams every 2 weeks until week 36. Then, you will have weekly prenatal exams. During a routine prenatal visit:   You will be weighed to make sure you and the fetus are growing normally.   Your blood pressure is taken.   Your abdomen will be  measured to track your baby's growth.   The fetal heartbeat will be listened to.   Any test results from the previous visit will be discussed.   You may have a cervical check near your due date to see if you have effaced.  At around 36 weeks, your caregiver will check your cervix. At the same time, your caregiver will also perform a test on the secretions of the vaginal tissue. This test is to determine if a type of bacteria, Group B streptococcus, is present. Your caregiver will explain this further.  Your caregiver may ask you:   What your birth plan is.   How you are feeling.   If you are feeling the baby move.   If you have had any abnormal symptoms, such as leaking fluid, bleeding, severe headaches, or abdominal cramping.   If you have any questions.  Other tests or screenings that may be performed during your third trimester include:   Blood tests that check for low iron levels (anemia).   Fetal testing to check the health, activity level, and growth of the fetus. Testing is done if you have certain medical conditions or if there are problems during the pregnancy.  FALSE LABOR  You may feel small, irregular contractions that eventually go away. These are called Braxton Hicks contractions, or   false labor. Contractions may last for hours, days, or even weeks before true labor sets in. If contractions come at regular intervals, intensify, or become painful, it is best to be seen by your caregiver.   SIGNS OF LABOR    Menstrual-like cramps.   Contractions that are 5 minutes apart or less.   Contractions that start on the top of the uterus and spread down to the lower abdomen and back.   A sense of increased pelvic pressure or back pain.   A watery or bloody mucus discharge that comes from the vagina.  If you have any of these signs before the 37th week of pregnancy, call your caregiver right away. You need to go to the hospital to get checked immediately.  HOME CARE INSTRUCTIONS    Avoid all  smoking, herbs, alcohol, and unprescribed drugs. These chemicals affect the formation and growth of the baby.   Follow your caregiver's instructions regarding medicine use. There are medicines that are either safe or unsafe to take during pregnancy.   Exercise only as directed by your caregiver. Experiencing uterine cramps is a good sign to stop exercising.   Continue to eat regular, healthy meals.   Wear a good support bra for breast tenderness.   Do not use hot tubs, steam rooms, or saunas.   Wear your seat belt at all times when driving.   Avoid raw meat, uncooked cheese, cat litter boxes, and soil used by cats. These carry germs that can cause birth defects in the baby.   Take your prenatal vitamins.   Try taking a stool softener (if your caregiver approves) if you develop constipation. Eat more high-fiber foods, such as fresh vegetables or fruit and whole grains. Drink plenty of fluids to keep your urine clear or pale yellow.   Take warm sitz baths to soothe any pain or discomfort caused by hemorrhoids. Use hemorrhoid cream if your caregiver approves.   If you develop varicose veins, wear support hose. Elevate your feet for 15 minutes, 3 4 times a day. Limit salt in your diet.   Avoid heavy lifting, wear low heal shoes, and practice good posture.   Rest a lot with your legs elevated if you have leg cramps or low back pain.   Visit your dentist if you have not gone during your pregnancy. Use a soft toothbrush to brush your teeth and be gentle when you floss.   A sexual relationship may be continued unless your caregiver directs you otherwise.   Do not travel far distances unless it is absolutely necessary and only with the approval of your caregiver.   Take prenatal classes to understand, practice, and ask questions about the labor and delivery.   Make a trial run to the hospital.   Pack your hospital bag.   Prepare the baby's nursery.   Continue to go to all your prenatal visits as directed  by your caregiver.  SEEK MEDICAL CARE IF:   You are unsure if you are in labor or if your water has broken.   You have dizziness.   You have mild pelvic cramps, pelvic pressure, or nagging pain in your abdominal area.   You have persistent nausea, vomiting, or diarrhea.   You have a bad smelling vaginal discharge.   You have pain with urination.  SEEK IMMEDIATE MEDICAL CARE IF:    You have a fever.   You are leaking fluid from your vagina.   You have spotting or bleeding from your vagina.     You have severe abdominal cramping or pain.   You have rapid weight loss or gain.   You have shortness of breath with chest pain.   You notice sudden or extreme swelling of your face, hands, ankles, feet, or legs.   You have not felt your baby move in over an hour.   You have severe headaches that do not go away with medicine.   You have vision changes.  Document Released: 04/24/2001 Document Revised: 12/31/2012 Document Reviewed: 07/01/2012  [employer] Patient Information 2014 [employer].

## 2013-05-14 NOTE — L&D Delivery Note (Signed)
Delivery Note At 4:48 PM a viable female was delivered via Vaginal, Spontaneous Delivery (Presentation: Left Occiput Anterior).  APGAR: 8, 9; weight 7 lb 15 oz (3600 g).   Placenta status: Intact, Spontaneous Pathology.  Cord: 3 vessels with the following complications: None.  Cord pH: 7.26  Anesthesia: Epidural  Episiotomy: None Lacerations: 2nd degree;Perineal Suture Repair: 3.0 vicryl rapide Est. Blood Loss (mL): 500  Mom to postpartum.  Baby to Couplet care / Skin to Skin.  Pt pushed with good maternal effort to deliver a liveborn female. Baby with a loose nuchal that was delivered through.  Spontaneous cry.  Delayed cord clamping performed. Cord pH sent due to fetal tachycardia and maternal fever.  Placenta delivered spontaneous intact with 3V cord with gentle traction and pitocin. Significant bleeding noted after delivery which responded to bimanual massage and cytotec 1000mcg PV.  A 2nd degree laceration was repaired in the usual fashion with 3-0 vicryl rapide.  No other complications. Baby to skin to skin and Mom to postpartum.   Yahya Boldman L 06/26/2013, 6:57 AM

## 2013-05-18 ENCOUNTER — Encounter: Payer: Self-pay | Admitting: *Deleted

## 2013-05-19 ENCOUNTER — Other Ambulatory Visit: Payer: Medicaid Other

## 2013-05-19 DIAGNOSIS — R030 Elevated blood-pressure reading, without diagnosis of hypertension: Secondary | ICD-10-CM

## 2013-05-19 LAB — COMPREHENSIVE METABOLIC PANEL
ALK PHOS: 79 U/L (ref 39–117)
ALT: 12 U/L (ref 0–35)
AST: 13 U/L (ref 0–37)
Albumin: 3.4 g/dL — ABNORMAL LOW (ref 3.5–5.2)
BUN: 5 mg/dL — ABNORMAL LOW (ref 6–23)
CALCIUM: 8.7 mg/dL (ref 8.4–10.5)
CO2: 21 mEq/L (ref 19–32)
Chloride: 105 mEq/L (ref 96–112)
Creat: 0.58 mg/dL (ref 0.50–1.10)
Glucose, Bld: 67 mg/dL — ABNORMAL LOW (ref 70–99)
Potassium: 4.1 mEq/L (ref 3.5–5.3)
SODIUM: 137 meq/L (ref 135–145)
TOTAL PROTEIN: 6 g/dL (ref 6.0–8.3)
Total Bilirubin: 0.4 mg/dL (ref 0.3–1.2)

## 2013-05-20 LAB — PROTEIN, URINE, 24 HOUR
PROTEIN 24H UR: 168 mg/d — AB (ref 50–100)
PROTEIN, URINE: 6 mg/dL

## 2013-05-27 ENCOUNTER — Ambulatory Visit (INDEPENDENT_AMBULATORY_CARE_PROVIDER_SITE_OTHER): Payer: Medicaid Other | Admitting: Family

## 2013-05-27 VITALS — BP 122/82 | Temp 97.0°F | Wt 290.7 lb

## 2013-05-27 DIAGNOSIS — O26879 Cervical shortening, unspecified trimester: Secondary | ICD-10-CM

## 2013-05-27 DIAGNOSIS — Z3400 Encounter for supervision of normal first pregnancy, unspecified trimester: Secondary | ICD-10-CM

## 2013-05-27 LAB — POCT URINALYSIS DIP (DEVICE)
Glucose, UA: NEGATIVE mg/dL
Hgb urine dipstick: NEGATIVE
KETONES UR: 15 mg/dL — AB
LEUKOCYTES UA: NEGATIVE
NITRITE: NEGATIVE
PH: 6 (ref 5.0–8.0)
PROTEIN: 30 mg/dL — AB
Specific Gravity, Urine: >=1.03 (ref 1.005–1.030)
UROBILINOGEN UA: 0.2 mg/dL (ref 0.0–1.0)

## 2013-05-27 LAB — OB RESULTS CONSOLE GBS: GBS: POSITIVE

## 2013-05-27 NOTE — Progress Notes (Signed)
No bleeding, leaking of fluid, or contractions.  Reports vaginal irritation, using nightly Prometrium for shortened cervix of pregnancy > may discontinue.  Pt returned 24 hr urine due to elevated blood pressure at last visit protein 168.  Normotensive today.  Schedule growth ultrasound, monitor blood pressure.

## 2013-05-27 NOTE — Progress Notes (Signed)
P= 97 C/o of mild intermittent lower abdominal/pelvic pressure.

## 2013-05-28 ENCOUNTER — Encounter: Payer: Self-pay | Admitting: *Deleted

## 2013-05-28 LAB — WET PREP, GENITAL
Clue Cells Wet Prep HPF POC: NONE SEEN
Trich, Wet Prep: NONE SEEN
YEAST WET PREP: NONE SEEN

## 2013-05-28 LAB — GC/CHLAMYDIA PROBE AMP
CT Probe RNA: NEGATIVE
GC Probe RNA: NEGATIVE

## 2013-05-31 LAB — CULTURE, BETA STREP (GROUP B ONLY)

## 2013-06-02 ENCOUNTER — Telehealth: Payer: Self-pay | Admitting: General Practice

## 2013-06-02 ENCOUNTER — Encounter: Payer: Self-pay | Admitting: Family

## 2013-06-02 NOTE — Telephone Encounter (Signed)
Called woman back and answered her questions regarding patient's disability request

## 2013-06-02 NOTE — Telephone Encounter (Signed)
A woman called from a management system (in regards [facility] on patient [name] and needs information regarding pregnancy and care dates and would like a call back. Her number is [phone]. Woman was difficult to understand due to accent.

## 2013-06-04 ENCOUNTER — Ambulatory Visit (INDEPENDENT_AMBULATORY_CARE_PROVIDER_SITE_OTHER): Payer: Medicaid Other | Admitting: Family

## 2013-06-04 ENCOUNTER — Ambulatory Visit (HOSPITAL_COMMUNITY)
Admission: RE | Admit: 2013-06-04 | Discharge: 2013-06-04 | Disposition: A | Discharge status: Home / Self Care | Payer: Medicaid Other | Source: Ambulatory Visit | Attending: Family | Admitting: Family

## 2013-06-04 VITALS — BP 126/70 | Wt 295.4 lb

## 2013-06-04 DIAGNOSIS — O26879 Cervical shortening, unspecified trimester: Secondary | ICD-10-CM

## 2013-06-04 DIAGNOSIS — O99340 Other mental disorders complicating pregnancy, unspecified trimester: Secondary | ICD-10-CM

## 2013-06-04 DIAGNOSIS — E669 Obesity, unspecified: Secondary | ICD-10-CM | POA: Insufficient documentation

## 2013-06-04 DIAGNOSIS — O3660X0 Maternal care for excessive fetal growth, unspecified trimester, not applicable or unspecified: Secondary | ICD-10-CM | POA: Insufficient documentation

## 2013-06-04 DIAGNOSIS — O99210 Obesity complicating pregnancy, unspecified trimester: Secondary | ICD-10-CM

## 2013-06-04 DIAGNOSIS — Z3400 Encounter for supervision of normal first pregnancy, unspecified trimester: Secondary | ICD-10-CM

## 2013-06-04 LAB — POCT URINALYSIS DIP (DEVICE)
Glucose, UA: NEGATIVE mg/dL
Hgb urine dipstick: NEGATIVE
Ketones, ur: 15 mg/dL — AB
LEUKOCYTES UA: NEGATIVE
Nitrite: NEGATIVE
Protein, ur: 30 mg/dL — AB
Specific Gravity, Urine: >=1.03 (ref 1.005–1.030)
UROBILINOGEN UA: 1 mg/dL (ref 0.0–1.0)
pH: 6 (ref 5.0–8.0)

## 2013-06-04 NOTE — Progress Notes (Signed)
Reviewed growth ultrasound 51%ile and beta strep positive > antibiotics in labor.  Normotensive.  Reviewed labor precautions.

## 2013-06-04 NOTE — Progress Notes (Signed)
P-73 

## 2013-06-10 ENCOUNTER — Inpatient Hospital Stay (HOSPITAL_COMMUNITY)
Admission: AD | Admit: 2013-06-10 | Discharge: 2013-06-10 | Disposition: A | Discharge status: Home / Self Care | Payer: Medicaid Other | Source: Ambulatory Visit | Attending: Obstetrics & Gynecology | Admitting: Obstetrics & Gynecology

## 2013-06-10 ENCOUNTER — Ambulatory Visit (INDEPENDENT_AMBULATORY_CARE_PROVIDER_SITE_OTHER): Payer: Medicaid Other | Admitting: Advanced Practice Midwife

## 2013-06-10 ENCOUNTER — Encounter (HOSPITAL_COMMUNITY): Payer: Self-pay | Admitting: General Practice

## 2013-06-10 VITALS — BP 140/94 | Temp 97.6°F | Wt 295.4 lb

## 2013-06-10 DIAGNOSIS — O99891 Other specified diseases and conditions complicating pregnancy: Secondary | ICD-10-CM | POA: Insufficient documentation

## 2013-06-10 DIAGNOSIS — IMO0001 Reserved for inherently not codable concepts without codable children: Secondary | ICD-10-CM

## 2013-06-10 DIAGNOSIS — Z3400 Encounter for supervision of normal first pregnancy, unspecified trimester: Secondary | ICD-10-CM

## 2013-06-10 DIAGNOSIS — O9989 Other specified diseases and conditions complicating pregnancy, childbirth and the puerperium: Principal | ICD-10-CM

## 2013-06-10 DIAGNOSIS — O133 Gestational [pregnancy-induced] hypertension without significant proteinuria, third trimester: Secondary | ICD-10-CM

## 2013-06-10 DIAGNOSIS — R030 Elevated blood-pressure reading, without diagnosis of hypertension: Secondary | ICD-10-CM | POA: Insufficient documentation

## 2013-06-10 DIAGNOSIS — O139 Gestational [pregnancy-induced] hypertension without significant proteinuria, unspecified trimester: Secondary | ICD-10-CM

## 2013-06-10 LAB — PROTEIN / CREATININE RATIO, URINE
CREATININE, URINE: 163.78 mg/dL
Protein Creatinine Ratio: 0.17 — ABNORMAL HIGH (ref 0.00–0.15)
TOTAL PROTEIN, URINE: 27.2 mg/dL

## 2013-06-10 LAB — CBC
HCT: 35 % — ABNORMAL LOW (ref 36.0–46.0)
Hemoglobin: 11.5 g/dL — ABNORMAL LOW (ref 12.0–15.0)
MCH: 27.8 pg (ref 26.0–34.0)
MCHC: 32.9 g/dL (ref 30.0–36.0)
MCV: 84.5 fL (ref 78.0–100.0)
Platelets: 185 10*3/uL (ref 150–400)
RBC: 4.14 MIL/uL (ref 3.87–5.11)
RDW: 13.6 % (ref 11.5–15.5)
WBC: 8 10*3/uL (ref 4.0–10.5)

## 2013-06-10 LAB — POCT URINALYSIS DIP (DEVICE)
Bilirubin Urine: NEGATIVE
GLUCOSE, UA: NEGATIVE mg/dL
Hgb urine dipstick: NEGATIVE
Ketones, ur: 15 mg/dL — AB
Leukocytes, UA: NEGATIVE
NITRITE: NEGATIVE
Protein, ur: NEGATIVE mg/dL
Specific Gravity, Urine: 1.025 (ref 1.005–1.030)
UROBILINOGEN UA: 0.2 mg/dL (ref 0.0–1.0)
pH: 5.5 (ref 5.0–8.0)

## 2013-06-10 LAB — COMPREHENSIVE METABOLIC PANEL
ALBUMIN: 2.9 g/dL — AB (ref 3.5–5.2)
ALT: 19 U/L (ref 0–35)
AST: 17 U/L (ref 0–37)
Alkaline Phosphatase: 90 U/L (ref 39–117)
BUN: 5 mg/dL — AB (ref 6–23)
CHLORIDE: 104 meq/L (ref 96–112)
CO2: 20 mEq/L (ref 19–32)
CREATININE: 0.6 mg/dL (ref 0.50–1.10)
Calcium: 9.1 mg/dL (ref 8.4–10.5)
GFR calc Af Amer: >90 mL/min (ref >90)
Glucose, Bld: 78 mg/dL (ref 70–99)
Potassium: 4 mEq/L (ref 3.7–5.3)
Sodium: 137 mEq/L (ref 137–147)
Total Bilirubin: 0.5 mg/dL (ref 0.3–1.2)
Total Protein: 6.7 g/dL (ref 6.0–8.3)

## 2013-06-10 MED ORDER — BUTALBITAL-APAP-CAFFEINE 50-325-40 MG PO TABS
1.0000 | ORAL_TABLET | ORAL | Status: DC | PRN
  Administered 2013-06-10: 1 via ORAL
  Filled 2013-06-10 (×2): qty 1

## 2013-06-10 NOTE — Progress Notes (Signed)
HA x 2 days. No Hx of same. To [facility]U for labs, P:C ratio, NST for R/O Pre-E. Dr. Erin FullingHarraway-Ariyona Eid consulted.

## 2013-06-10 NOTE — MAU Provider Note (Signed)
Chief Complaint:  Hypertension   First Provider Initiated Contact with Patient 06/10/13 1509      HPI: [name] is a 33 y.o. G1P0 at [redacted]w[redacted]d who had BP elevations of 140/94 and 138/94 as well as frontal H/A when seen at [facility] for routine visit this am. Cx closed at prenatal visit. No visual disturbance or epigastric pain.  Denies contractions, leakage of fluid or vaginal bleeding. Good fetal movement.   Pregnancy Course: Obese, cx shortening. Baseline BP 117/73  Past Medical History: Past Medical History  Diagnosis Date  . Anxiety     Past obstetric history: OB History  Gravida Para Term Preterm AB SAB TAB Ectopic Multiple Living  1             # Outcome Date GA Lbr Len/2nd Weight Sex Delivery Anes PTL Lv  1 CUR               Past Surgical History: Past Surgical History  Procedure Laterality Date  . Wisdom tooth extraction  2008     Family History: History reviewed. No pertinent family history.  Social History: History  Substance Use Topics  . Smoking status: Never Smoker   . Smokeless tobacco: Never Used  . Alcohol Use: No    Allergies: No Known Allergies  Meds:  Prescriptions prior to admission  Medication Sig Dispense Refill  . acetaminophen (TYLENOL) 325 MG tablet Take 650 mg by mouth every 6 (six) hours as needed.      . loratadine (CLARITIN) 10 MG tablet Take 10 mg by mouth as needed for allergies.      . Prenatal Vit-Fe Fumarate-FA (PRENATAL VITAMINS PLUS) 27-1 MG TABS Take 1 tablet by mouth daily.        ROS: Pertinent findings in history of present illness.  Physical Exam  Blood pressure 123/80, pulse 99, resp. rate 18, height 5\' 9"  (1.753 m), weight 134.355 kg (296 lb 3.2 oz), last menstrual period 09/17/2012.  Filed Vitals:   06/10/13 1431 06/10/13 1446 06/10/13 1501 06/10/13 1525  BP: 129/95 132/75 123/80 123/80  Pulse: 106 92 99 99  Resp:    18  Height:      Weight:       GENERAL: Well-developed, well-nourished female in no acute  distress.  HEENT: normocephalic HEART: normal rate RESP: normal effort ABDOMEN: Soft, non-tender, gravid appropriate for gestational age EXTREMITIES: Nontender, no edema NEURO: alert and oriented     FHT:  Baseline 150 , moderate variability, accelerations present, no decelerations Contractions: occasional, mild   Labs: Results for orders placed during the hospital encounter of 06/10/13 (from the past 24 hour(s))  PROTEIN / CREATININE RATIO, URINE     Status: Abnormal   Collection Time    06/10/13 12:00 PM      Result Value Range   Creatinine, Urine 163.78     Total Protein, Urine 27.2     PROTEIN CREATININE RATIO 0.17 (*) 0.00 - 0.15  CBC     Status: Abnormal   Collection Time    06/10/13 12:51 PM      Result Value Range   WBC 8.0  4.0 - 10.5 K/uL   RBC 4.14  3.87 - 5.11 MIL/uL   Hemoglobin 11.5 (*) 12.0 - 15.0 g/dL   HCT 16.1 (*) 09.6 - 04.5 %   MCV 84.5  78.0 - 100.0 fL   MCH 27.8  26.0 - 34.0 pg   MCHC 32.9  30.0 - 36.0 g/dL   RDW 13.6  11.5 - 15.5 %   Platelets 185  150 - 400 K/uL  COMPREHENSIVE METABOLIC PANEL     Status: Abnormal   Collection Time    06/10/13 12:51 PM      Result Value Range   Sodium 137  137 - 147 mEq/L   Potassium 4.0  3.7 - 5.3 mEq/L   Chloride 104  96 - 112 mEq/L   CO2 20  19 - 32 mEq/L   Glucose, Bld 78  70 - 99 mg/dL   BUN 5 (*) 6 - 23 mg/dL   Creatinine, Ser 1.610.60  0.50 - 1.10 mg/dL   Calcium 9.1  8.4 - 09.610.5 mg/dL   Total Protein 6.7  6.0 - 8.3 g/dL   Albumin 2.9 (*) 3.5 - 5.2 g/dL   AST 17  0 - 37 U/L   ALT 19  0 - 35 U/L   Alkaline Phosphatase 90  39 - 117 U/L   Total Bilirubin 0.5  0.3 - 1.2 mg/dL   GFR calc non Af Amer >90  >90 mL/min   GFR calc Af Amer >90  >90 mL/min    Imaging:  [place] [facility] Follow Up  06/04/2013   OBSTETRICAL ULTRASOUND: This exam was performed within a [facility]. The OB US report was generated in the AS system, and faxed to the ordering physician.   This report is also available in  [facility]'s [employer] and in the [employer] PACS. See [facility] Obstetric US report.  62nd %ile, AFI 15  MAU Course: Fiorocet 1 po for H/A with relief D/W Dr. Erin FullingHarraway-Smith Assessment: 1. Elevated BP   G1 at 5632w0d  With borderline BP elevations, not sustained and H/A improved with tx.  Plan: Discharge home with preE precautions Labor precautions and fetal kick counts Follow-up Information   [facility] Schedule an appointment as soon as possible for a visit in 1 week.   Specialty:  Obstetrics and Gynecology   Contact information:   [address] [place] [address] [phone]       Medication List         acetaminophen 325 MG tablet  Commonly known as:  TYLENOL  Take 650 mg by mouth every 6 (six) hours as needed.     loratadine 10 MG tablet  Commonly known as:  CLARITIN  Take 10 mg by mouth as needed for allergies.     PRENATAL VITAMINS PLUS 27-1 MG Tabs  Take 1 tablet by mouth daily.        Danae Orleanseirdre C Solana Coggin, CNM 06/10/2013 3:23 PM

## 2013-06-10 NOTE — MAU Provider Note (Signed)
Attestation of Attending Supervision of Advanced Practitioner (CNM/NP): Evaluation and management procedures were performed by the Advanced Practitioner under my supervision and collaboration.  I have reviewed the Advanced Practitioner's note and chart, and I agree with the management and plan.  HARRAWAY-SMITH, Kahlin Mark 4:56 PM

## 2013-06-10 NOTE — MAU Note (Signed)
[facility] and her blood pressure was elevated. Was sent to MAU for further evaluation. Patient denies contractions, leaking or bleeding. Reports having headaches but no blurred vision or spots. Reports good fetal movement.

## 2013-06-10 NOTE — MAU Note (Signed)
Pt [facility] clinic [facility]

## 2013-06-10 NOTE — Progress Notes (Signed)
Pulse- 100 Patient reports pelvic and vaginal pressure

## 2013-06-15 ENCOUNTER — Encounter: Payer: Self-pay | Admitting: *Deleted

## 2013-06-16 ENCOUNTER — Ambulatory Visit (INDEPENDENT_AMBULATORY_CARE_PROVIDER_SITE_OTHER): Payer: Medicaid Other | Admitting: Obstetrics and Gynecology

## 2013-06-16 VITALS — BP 134/89 | Wt 300.1 lb

## 2013-06-16 DIAGNOSIS — O133 Gestational [pregnancy-induced] hypertension without significant proteinuria, third trimester: Secondary | ICD-10-CM

## 2013-06-16 DIAGNOSIS — O139 Gestational [pregnancy-induced] hypertension without significant proteinuria, unspecified trimester: Secondary | ICD-10-CM

## 2013-06-16 DIAGNOSIS — O26879 Cervical shortening, unspecified trimester: Secondary | ICD-10-CM

## 2013-06-16 DIAGNOSIS — O99340 Other mental disorders complicating pregnancy, unspecified trimester: Secondary | ICD-10-CM

## 2013-06-16 DIAGNOSIS — Z3400 Encounter for supervision of normal first pregnancy, unspecified trimester: Secondary | ICD-10-CM

## 2013-06-16 LAB — POCT URINALYSIS DIP (DEVICE)
BILIRUBIN URINE: NEGATIVE
Glucose, UA: 100 mg/dL — AB
Hgb urine dipstick: NEGATIVE
Ketones, ur: 15 mg/dL — AB
LEUKOCYTES UA: NEGATIVE
NITRITE: NEGATIVE
PH: 6 (ref 5.0–8.0)
PROTEIN: 30 mg/dL — AB
Specific Gravity, Urine: >=1.03 (ref 1.005–1.030)
Urobilinogen, UA: 0.2 mg/dL (ref 0.0–1.0)

## 2013-06-16 NOTE — Progress Notes (Signed)
Patient is doing well without complaints. Patient reports fewer headaches in comparison to last week. FM/preeclampsia/labor precautions reviewed.

## 2013-06-16 NOTE — Progress Notes (Signed)
P = 107  Pt reports less incidence of H/A this week- has H/A now, pain scale- 3.

## 2013-06-16 NOTE — Patient Instructions (Signed)
Contraception Choices °Birth control (contraception) is the use of any methods or devices to stop pregnancy from happening. Below are some methods to help avoid pregnancy. °HORMONAL BIRTH CONTROL °· A small tube put under the skin of the upper arm (implant). The tube can stay in place for 3 years. The implant must be taken out after 3 years. °· Shots given every 3 months. °· Pills taken every day. °· Patches that are changed once a week. °· A ring put into the vagina (vaginal ring). The ring is left in place for 3 weeks and removed for 1 week. Then, a new ring is put in the vagina. °· Emergency birth control pills taken after unprotected sex (intercourse). °BARRIER BIRTH CONTROL  °· A thin covering worn on the penis (female condom) during sex. °· A soft, loose covering put into the vagina (female condom) before sex. °· A rubber bowl that sits over the cervix (diaphragm). The bowl must be made for you. The bowl is put into the vagina before sex. The bowl is left in place for 6 to 8 hours after sex. °· A small, soft cup that fits over the cervix (cervical cap). The cup must be made for you. The cup can be left in place for 48 hours after sex. °· A sponge that is put into the vagina before sex. °· A chemical that kills or stops sperm from getting into the cervix and uterus (spermicide). The chemical may be a cream, jelly, foam, or pill. °INTRAUTERINE (IUD) BIRTH CONTROL  °· IUD birth control is a small, T-shaped piece of plastic. The plastic is put inside the uterus. There are 2 types of IUD: °· Copper IUD. The IUD is covered in copper wire. The copper makes a fluid that kills sperm. It can stay in place for 10 years. °· Hormone IUD. The hormone stops pregnancy from happening. It can stay in place for 5 years. °PERMANENT METHODS °· When the woman has her fallopian tubes sealed, tied, or blocked during surgery. This stops the egg from traveling to the uterus. °· The doctor places a small coil or insert into each fallopian  tube. This causes scar tissue to form and blocks the fallopian tubes. °· When the female has the tubes that carry sperm tied off (vasectomy). °NATURAL FAMILY PLANNING BIRTH CONTROL  °· Natural family planning means not having sex or using barrier birth control on the days the woman could become pregnant. °· Use a calendar to keep track of the length of each period and know the days she can get pregnant. °· Avoid sex during ovulation. °· Use a thermometer to measure body temperature. Also watch for symptoms of ovulation. °· Time sex to be after the woman has ovulated. °Use condoms to help protect yourself against sexually transmitted infections (STIs). Do this no matter what type of birth control you use. Talk to your doctor about which type of birth control is best for you. °Document Released: 02/25/2009 Document Revised: 12/31/2012 Document Reviewed: 11/19/2012 °ExitCare® Patient Information ©2014 [employer]. ° °

## 2013-06-21 ENCOUNTER — Encounter (HOSPITAL_COMMUNITY): Payer: Self-pay | Admitting: Family

## 2013-06-21 ENCOUNTER — Inpatient Hospital Stay (HOSPITAL_COMMUNITY)
Admission: AD | Admit: 2013-06-21 | Discharge: 2013-06-21 | Disposition: A | Discharge status: Home / Self Care | Payer: Medicaid Other | Source: Ambulatory Visit | Attending: Obstetrics & Gynecology | Admitting: Obstetrics & Gynecology

## 2013-06-21 DIAGNOSIS — O133 Gestational [pregnancy-induced] hypertension without significant proteinuria, third trimester: Secondary | ICD-10-CM

## 2013-06-21 DIAGNOSIS — Z3400 Encounter for supervision of normal first pregnancy, unspecified trimester: Secondary | ICD-10-CM

## 2013-06-21 DIAGNOSIS — O139 Gestational [pregnancy-induced] hypertension without significant proteinuria, unspecified trimester: Secondary | ICD-10-CM

## 2013-06-21 DIAGNOSIS — IMO0002 Reserved for concepts with insufficient information to code with codable children: Secondary | ICD-10-CM | POA: Insufficient documentation

## 2013-06-21 DIAGNOSIS — O99340 Other mental disorders complicating pregnancy, unspecified trimester: Secondary | ICD-10-CM

## 2013-06-21 DIAGNOSIS — R609 Edema, unspecified: Secondary | ICD-10-CM

## 2013-06-21 LAB — URINALYSIS, ROUTINE W REFLEX MICROSCOPIC
Bilirubin Urine: NEGATIVE
Glucose, UA: NEGATIVE mg/dL
Hgb urine dipstick: NEGATIVE
KETONES UR: NEGATIVE mg/dL
Leukocytes, UA: NEGATIVE
NITRITE: NEGATIVE
Protein, ur: NEGATIVE mg/dL
Specific Gravity, Urine: >1.03 — ABNORMAL HIGH (ref 1.005–1.030)
UROBILINOGEN UA: 0.2 mg/dL (ref 0.0–1.0)
pH: 6 (ref 5.0–8.0)

## 2013-06-21 NOTE — MAU Note (Signed)
33 yo, G1P0 at 7217w4d, presents to MAU with c/o R foot and leg swelling; reports intermittent elevated pressures in recent weeks. Denies HA, blurred vision, LUQ pain. Reports FM; denies VB, LOF, contractions.

## 2013-06-21 NOTE — Discharge Instructions (Signed)
Edema Edema is an abnormal build-up of fluids in tissues. Because this is partly dependent on gravity (water flows to the lowest place), it is more common in the legs and thighs (lower extremities). It is also common in the looser tissues, like around the eyes. Painless swelling of the feet and ankles is common and increases as a person ages. It may affect both legs and may include the calves or even thighs. When squeezed, the fluid may move out of the affected area and may leave a dent for a few moments. CAUSES   Prolonged standing or sitting in one place for extended periods of time. Movement helps pump tissue fluid into the veins, and absence of movement prevents this, resulting in edema.  Varicose veins. The valves in the veins do not work as well as they should. This causes fluid to leak into the tissues.  Fluid and salt overload.  Injury, burn, or surgery to the leg, ankle, or foot, may damage veins and allow fluid to leak out.  Sunburn damages vessels. Leaky vessels allow fluid to go out into the sunburned tissues.  Allergies (from insect bites or stings, medications or chemicals) cause swelling by allowing vessels to become leaky.  Protein in the blood helps keep fluid in your vessels. Low protein, as in malnutrition, allows fluid to leak out.  Hormonal changes, including pregnancy and menstruation, cause fluid retention. This fluid may leak out of vessels and cause edema.  Medications that cause fluid retention. Examples are sex hormones, blood pressure medications, steroid treatment, or anti-depressants.  Some illnesses cause edema, especially heart failure, kidney disease, or liver disease.  Surgery that cuts veins or lymph nodes, such as surgery done for the heart or for breast cancer, may result in edema. DIAGNOSIS  Your caregiver is usually easily able to determine what is causing your swelling (edema) by simply asking what is wrong (getting a history) and examining you (doing  a physical). Sometimes x-rays, EKG (electrocardiogram or heart tracing), and blood work may be done to evaluate for underlying medical illness. TREATMENT  General treatment includes:  Leg elevation (or elevation of the affected body part).  Restriction of fluid intake.  Prevention of fluid overload.  Compression of the affected body part. Compression with elastic bandages or support stockings squeezes the tissues, preventing fluid from entering and forcing it back into the blood vessels.  Diuretics (also called water pills or fluid pills) pull fluid out of your body in the form of increased urination. These are effective in reducing the swelling, but can have side effects and must be used only under your caregiver's supervision. Diuretics are appropriate only for some types of edema. The specific treatment can be directed at any underlying causes discovered. Heart, liver, or kidney disease should be treated appropriately. HOME CARE INSTRUCTIONS   Elevate the legs (or affected body part) above the level of the heart, while lying down.  Avoid sitting or standing still for prolonged periods of time.  Avoid putting anything directly under the knees when lying down, and do not wear constricting clothing or garters on the upper legs.  Exercising the legs causes the fluid to work back into the veins and lymphatic channels. This may help the swelling go down.  The pressure applied by elastic bandages or support stockings can help reduce ankle swelling.  A low-salt diet may help reduce fluid retention and decrease the ankle swelling.  Take any medications exactly as prescribed. SEEK MEDICAL CARE IF:  Your edema is   not responding to recommended treatments. SEEK IMMEDIATE MEDICAL CARE IF:   You develop shortness of breath or chest pain.  You cannot breathe when you lay down; or if, while lying down, you have to get up and go to the window to get your breath.  You are having increasing  swelling without relief from treatment.  You develop a fever over 102 F (38.9 C).  You develop pain or redness in the areas that are swollen.  Tell your caregiver right away if you have gained 03 lb/1.4 kg in 1 day or 05 lb/2.3 kg in a week. MAKE SURE YOU:   Understand these instructions.  Will watch your condition.  Will get help right away if you are not doing well or get worse. Document Released: 04/30/2005 Document Revised: 10/30/2011 Document Reviewed: 12/17/2007 ExitCare Patient Information 2014 [employer].  

## 2013-06-21 NOTE — MAU Provider Note (Signed)
History     CSN: [id number]  Arrival date and time: 06/21/13 1600   First Provider Initiated Contact with Patient 06/21/13 1753      Chief Complaint  Patient presents with  . Leg Swelling   HPI Comments: [name] 33 y.o. G1P0 presents to MAU with new swelling in legs and feet since yesterday. She was only sitting at her job yesterday and not doing any walking. They did go down a bit this morning. She also has had off and on headaches for the last few weeks. She denies migraine. She denies any blurred vision, abdominal pains. Last BP taken was 134/82. She has good fetal movement. No LOF, vaginal bleeding, contractions.       Past Medical History  Diagnosis Date  . Anxiety     Past Surgical History  Procedure Laterality Date  . Wisdom tooth extraction  2008    History reviewed. No pertinent family history.  History  Substance Use Topics  . Smoking status: Never Smoker   . Smokeless tobacco: Never Used  . Alcohol Use: No    Allergies: No Known Allergies  Prescriptions prior to admission  Medication Sig Dispense Refill  . acetaminophen (TYLENOL) 325 MG tablet Take 650 mg by mouth every 6 (six) hours as needed for headache.       . Prenatal Vit-Fe Fumarate-FA (PRENATAL MULTIVITAMIN) TABS tablet Take 1 tablet by mouth daily at 12 noon.        Review of Systems  Constitutional: Negative.   Eyes: Negative for blurred vision.  Cardiovascular: Negative for chest pain.  Gastrointestinal: Negative for abdominal pain.  Genitourinary: Negative.   Musculoskeletal:       Swelling feet/ legs  Skin: Negative.   Neurological: Positive for headaches.  Psychiatric/Behavioral: Negative.    Physical Exam   Blood pressure 123/80, pulse 96, temperature 98.4 F (36.9 C), temperature source Oral, resp. rate 16, last menstrual period 09/17/2012.  Physical Exam  Constitutional: She is oriented to person, place, and time. She appears well-developed and well-nourished. No  distress.  HENT:  Head: Normocephalic.  Eyes: Pupils are equal, round, and reactive to light.  Cardiovascular: Normal rate, regular rhythm and normal heart sounds.   Respiratory: Effort normal and breath sounds normal.  GI: Soft. She exhibits no distension. There is no tenderness. There is no rebound.  Genitourinary:  Cervix closed and long  Musculoskeletal: She exhibits edema.  +1/2 edema  Neurological: She is alert and oriented to person, place, and time.  Skin: Skin is warm and dry.  Psychiatric: She has a normal mood and affect. Her behavior is normal. Judgment and thought content normal.   Results for orders placed during the hospital encounter of 06/21/13 (from the past 24 hour(s))  URINALYSIS, ROUTINE W REFLEX MICROSCOPIC     Status: Abnormal   Collection Time    06/21/13  4:21 PM      Result Value Range   Color, Urine YELLOW  YELLOW   APPearance HAZY (*) CLEAR   Specific Gravity, Urine >1.030 (*) 1.005 - 1.030   pH 6.0  5.0 - 8.0   Glucose, UA NEGATIVE  NEGATIVE mg/dL   Hgb urine dipstick NEGATIVE  NEGATIVE   Bilirubin Urine NEGATIVE  NEGATIVE   Ketones, ur NEGATIVE  NEGATIVE mg/dL   Protein, ur NEGATIVE  NEGATIVE mg/dL   Urobilinogen, UA 0.2  0.0 - 1.0 mg/dL   Nitrite NEGATIVE  NEGATIVE   Leukocytes, UA NEGATIVE  NEGATIVE    MAU Course  Procedures  MDM  Called Dr Despina Hidden who feels she can go home  Assessment and Plan   A: Edema in pregnancy  P: Advised on elevation Increasing fluids Rest and follow up with your regular appointment on Tuesday   Carolynn Serve 06/21/2013, 6:05 PM

## 2013-06-22 ENCOUNTER — Telehealth: Payer: Self-pay | Admitting: General Practice

## 2013-06-22 NOTE — Telephone Encounter (Signed)
Patient called and left message stating she would like a nurse to call her back because she is having some swelling. Called patient back and she stated she went to MAU over the weekend and they helped answer all her questions. Patient had no further questions

## 2013-06-23 ENCOUNTER — Encounter: Payer: Self-pay | Admitting: Obstetrics & Gynecology

## 2013-06-23 ENCOUNTER — Encounter: Payer: Self-pay | Admitting: *Deleted

## 2013-06-23 ENCOUNTER — Ambulatory Visit (INDEPENDENT_AMBULATORY_CARE_PROVIDER_SITE_OTHER): Payer: Medicaid Other | Admitting: Obstetrics & Gynecology

## 2013-06-23 VITALS — BP 130/90 | Temp 97.1°F | Wt 303.1 lb

## 2013-06-23 DIAGNOSIS — O133 Gestational [pregnancy-induced] hypertension without significant proteinuria, third trimester: Secondary | ICD-10-CM

## 2013-06-23 DIAGNOSIS — O139 Gestational [pregnancy-induced] hypertension without significant proteinuria, unspecified trimester: Secondary | ICD-10-CM

## 2013-06-23 LAB — POCT URINALYSIS DIP (DEVICE)
Bilirubin Urine: NEGATIVE
Glucose, UA: 100 mg/dL — AB
HGB URINE DIPSTICK: NEGATIVE
Ketones, ur: NEGATIVE mg/dL
NITRITE: NEGATIVE
PH: 7 (ref 5.0–8.0)
Protein, ur: 30 mg/dL — AB
Specific Gravity, Urine: 1.025 (ref 1.005–1.030)
Urobilinogen, UA: 0.2 mg/dL (ref 0.0–1.0)

## 2013-06-23 NOTE — Progress Notes (Signed)
P=96 Edema in feet.

## 2013-06-23 NOTE — Progress Notes (Signed)
Pt declines contraception.  +FM, No VB;  No LOF.  No HA or visual changes. Cervix VERY posterior  IOL 05/24/2013 for chronic HTN

## 2013-06-23 NOTE — Patient Instructions (Signed)
Labor Induction  Labor induction is when steps are taken to cause a pregnant woman to begin the labor process. Most women go into labor on their own between 37 weeks and 42 weeks of the pregnancy. When this does not happen or when there is a medical need, methods may be used to induce labor. Labor induction causes a pregnant woman's uterus to contract. It also causes the cervix to soften (ripen), open (dilate), and thin out (efface). Usually, labor is not induced before 39 weeks of the pregnancy unless there is a problem with the baby or mother.  Before inducing labor, your health care provider will consider a number of factors, including the following:  The medical condition of you and the baby.   How many weeks along you are.   The status of the baby's lung maturity.   The condition of the cervix.   The position of the baby.  WHAT ARE THE REASONS FOR LABOR INDUCTION? Labor may be induced for the following reasons:  The health of the baby or mother is at risk.   The pregnancy is overdue by 1 week or more.   The water breaks but labor does not start on its own.   The mother has a health condition or serious illness, such as high blood pressure, infection, placental abruption, or diabetes.  The amniotic fluid amounts are low around the baby.   The baby is distressed.  Convenience or wanting the baby to be born on a certain date is not a reason for inducing labor. WHAT METHODS ARE USED FOR LABOR INDUCTION? Several methods of labor induction may be used, such as:   Prostaglandin medicine. This medicine causes the cervix to dilate and ripen. The medicine will also start contractions. It can be taken by mouth or by inserting a suppository into the vagina.   Inserting a thin tube (catheter) with a balloon on the end into the vagina to dilate the cervix. Once inserted, the balloon is expanded with water, which causes the cervix to open.   Stripping the membranes. Your health  care provider separates amniotic sac tissue from the cervix, causing the cervix to be stretched and causing the release of a hormone called progesterone. This may cause the uterus to contract. It is often done during an office visit. You will be sent home to wait for the contractions to begin. You will then come in for an induction.   Breaking the water. Your health care provider makes a hole in the amniotic sac using a small instrument. Once the amniotic sac breaks, contractions should begin. This may still take hours to see an effect.   Medicine to trigger or strengthen contractions. This medicine is given through an IV access tube inserted into a vein in your arm.  All of the methods of induction, besides stripping the membranes, will be done in the hospital. Induction is done in the hospital so that you and the baby can be carefully monitored.  HOW LONG DOES IT TAKE FOR LABOR TO BE INDUCED? Some inductions can take up to 2 3 days. Depending on the cervix, it usually takes less time. It takes longer when you are induced early in the pregnancy or if this is your first pregnancy. If a mother is still pregnant and the induction has been going on for 2 3 days, either the mother will be sent home or a cesarean delivery will be needed. WHAT ARE THE RISKS ASSOCIATED WITH LABOR INDUCTION? Some of the risks   of induction include:   Changes in fetal heart rate, such as too high, too low, or erratic.   Fetal distress.   Chance of infection for the mother and baby.   Increased chance of having a cesarean delivery.   Breaking off (abruption) of the placenta from the uterus (rare).   Uterine rupture (very rare).  When induction is needed for medical reasons, the benefits of induction may outweigh the risks. WHAT ARE SOME REASONS FOR NOT INDUCING LABOR? Labor induction should not be done if:   It is shown that your baby does not tolerate labor.   You have had previous surgeries on your  uterus, such as a myomectomy or the removal of fibroids.   Your placenta lies very low in the uterus and blocks the opening of the cervix (placenta previa).   Your baby is not in a head-down position.   The umbilical cord drops down into the birth canal in front of the baby. This could cut off the baby's blood and oxygen supply.   You have had a previous cesarean delivery.   There are unusual circumstances, such as the baby being extremely premature.  Document Released: 09/19/2006 Document Revised: 12/31/2012 Document Reviewed: 11/27/2012 ExitCare Patient Information 2014 [employer].  

## 2013-06-24 ENCOUNTER — Inpatient Hospital Stay (HOSPITAL_COMMUNITY)
Admission: RE | Admit: 2013-06-24 | Discharge: 2013-06-27 | DRG: 774 | Disposition: A | Discharge status: Home / Self Care | Payer: Medicaid Other | Source: Ambulatory Visit | Attending: Family Medicine | Admitting: Family Medicine

## 2013-06-24 VITALS — BP 122/79 | HR 74 | Temp 98.2°F | Resp 18 | Ht 69.0 in | Wt 303.0 lb

## 2013-06-24 DIAGNOSIS — Z6841 Body Mass Index (BMI) 40.0 and over, adult: Secondary | ICD-10-CM

## 2013-06-24 DIAGNOSIS — E669 Obesity, unspecified: Secondary | ICD-10-CM | POA: Diagnosis present

## 2013-06-24 DIAGNOSIS — O99892 Other specified diseases and conditions complicating childbirth: Secondary | ICD-10-CM | POA: Diagnosis present

## 2013-06-24 DIAGNOSIS — Z22330 Carrier of Group B streptococcus: Secondary | ICD-10-CM

## 2013-06-24 DIAGNOSIS — O99340 Other mental disorders complicating pregnancy, unspecified trimester: Secondary | ICD-10-CM

## 2013-06-24 DIAGNOSIS — Z3400 Encounter for supervision of normal first pregnancy, unspecified trimester: Secondary | ICD-10-CM

## 2013-06-24 DIAGNOSIS — O9989 Other specified diseases and conditions complicating pregnancy, childbirth and the puerperium: Secondary | ICD-10-CM

## 2013-06-24 DIAGNOSIS — O133 Gestational [pregnancy-induced] hypertension without significant proteinuria, third trimester: Secondary | ICD-10-CM | POA: Diagnosis present

## 2013-06-24 DIAGNOSIS — O139 Gestational [pregnancy-induced] hypertension without significant proteinuria, unspecified trimester: Principal | ICD-10-CM | POA: Diagnosis present

## 2013-06-24 DIAGNOSIS — O99214 Obesity complicating childbirth: Secondary | ICD-10-CM

## 2013-06-24 LAB — COMPREHENSIVE METABOLIC PANEL
ALBUMIN: 2.8 g/dL — AB (ref 3.5–5.2)
ALT: 9 U/L (ref 0–35)
AST: 15 U/L (ref 0–37)
Alkaline Phosphatase: 105 U/L (ref 39–117)
BILIRUBIN TOTAL: 0.5 mg/dL (ref 0.3–1.2)
BUN: 5 mg/dL — ABNORMAL LOW (ref 6–23)
CHLORIDE: 106 meq/L (ref 96–112)
CO2: 20 mEq/L (ref 19–32)
CREATININE: 0.59 mg/dL (ref 0.50–1.10)
Calcium: 9.4 mg/dL (ref 8.4–10.5)
GLUCOSE: 84 mg/dL (ref 70–99)
Potassium: 4.1 mEq/L (ref 3.7–5.3)
Sodium: 138 mEq/L (ref 137–147)
Total Protein: 6.2 g/dL (ref 6.0–8.3)

## 2013-06-24 LAB — PROTEIN / CREATININE RATIO, URINE
Creatinine, Urine: 253.57 mg/dL
Protein Creatinine Ratio: 0.14 (ref 0.00–0.15)
Total Protein, Urine: 36.6 mg/dL

## 2013-06-24 LAB — CBC
HCT: 35 % — ABNORMAL LOW (ref 36.0–46.0)
Hemoglobin: 11.3 g/dL — ABNORMAL LOW (ref 12.0–15.0)
MCH: 27.4 pg (ref 26.0–34.0)
MCHC: 32.3 g/dL (ref 30.0–36.0)
MCV: 84.7 fL (ref 78.0–100.0)
PLATELETS: 170 10*3/uL (ref 150–400)
RBC: 4.13 MIL/uL (ref 3.87–5.11)
RDW: 13.6 % (ref 11.5–15.5)
WBC: 7.3 10*3/uL (ref 4.0–10.5)

## 2013-06-24 LAB — URINE MICROSCOPIC-ADD ON

## 2013-06-24 LAB — URINALYSIS, ROUTINE W REFLEX MICROSCOPIC
Bilirubin Urine: NEGATIVE
GLUCOSE, UA: NEGATIVE mg/dL
Ketones, ur: NEGATIVE mg/dL
LEUKOCYTES UA: NEGATIVE
Nitrite: NEGATIVE
PROTEIN: NEGATIVE mg/dL
Specific Gravity, Urine: >1.03 — ABNORMAL HIGH (ref 1.005–1.030)
UROBILINOGEN UA: 0.2 mg/dL (ref 0.0–1.0)
pH: 5.5 (ref 5.0–8.0)

## 2013-06-24 LAB — TYPE AND SCREEN
ABO/RH(D): AB POS
Antibody Screen: NEGATIVE

## 2013-06-24 LAB — RPR: RPR Ser Ql: NONREACTIVE

## 2013-06-24 LAB — ABO/RH: ABO/RH(D): AB POS

## 2013-06-24 MED ORDER — MISOPROSTOL 200 MCG PO TABS
ORAL_TABLET | ORAL | Status: AC
  Filled 2013-06-24: qty 1

## 2013-06-24 MED ORDER — ONDANSETRON HCL 4 MG/2ML IJ SOLN
4.0000 mg | Freq: Four times a day (QID) | INTRAMUSCULAR | Status: DC | PRN
  Administered 2013-06-24 – 2013-06-25 (×2): 4 mg via INTRAVENOUS
  Filled 2013-06-24 (×2): qty 2

## 2013-06-24 MED ORDER — FLEET ENEMA 7-19 GM/118ML RE ENEM: 1.0000 | ENEMA | RECTAL | Status: DC | PRN

## 2013-06-24 MED ORDER — OXYTOCIN 40 UNITS IN LACTATED RINGERS INFUSION - SIMPLE MED
1.0000 m[IU]/min | INTRAVENOUS | Status: DC
Start: 2013-06-24 — End: 2013-06-25
  Administered 2013-06-24: 2 m[IU]/min via INTRAVENOUS

## 2013-06-24 MED ORDER — LIDOCAINE HCL (PF) 1 % IJ SOLN
30.0000 mL | INTRAMUSCULAR | Status: DC | PRN
  Administered 2013-06-25: 30 mL via SUBCUTANEOUS
  Filled 2013-06-24: qty 30

## 2013-06-24 MED ORDER — LACTATED RINGERS IV SOLN
500.0000 mL | INTRAVENOUS | Status: DC | PRN
  Administered 2013-06-25 (×2): 300 mL via INTRAVENOUS

## 2013-06-24 MED ORDER — OXYTOCIN BOLUS FROM INFUSION
500.0000 mL | INTRAVENOUS | Status: DC
  Administered 2013-06-25: 500 mL via INTRAVENOUS

## 2013-06-24 MED ORDER — PENICILLIN G POTASSIUM 5000000 UNITS IJ SOLR
2.5000 10*6.[IU] | INTRAVENOUS | Status: DC
  Administered 2013-06-24 – 2013-06-25 (×8): 2.5 10*6.[IU] via INTRAVENOUS
  Filled 2013-06-24 (×11): qty 2.5

## 2013-06-24 MED ORDER — IBUPROFEN 600 MG PO TABS
600.0000 mg | ORAL_TABLET | Freq: Four times a day (QID) | ORAL | Status: DC | PRN
  Administered 2013-06-25: 600 mg via ORAL
  Filled 2013-06-24: qty 1

## 2013-06-24 MED ORDER — MISOPROSTOL 25 MCG QUARTER TABLET
25.0000 ug | ORAL_TABLET | Freq: Once | ORAL | Status: AC
  Administered 2013-06-24: 25 ug via VAGINAL
  Filled 2013-06-24: qty 1
  Filled 2013-06-24: qty 0.25

## 2013-06-24 MED ORDER — FENTANYL CITRATE 0.05 MG/ML IJ SOLN
100.0000 ug | INTRAMUSCULAR | Status: DC | PRN
  Administered 2013-06-24 – 2013-06-25 (×5): 100 ug via INTRAVENOUS
  Filled 2013-06-24 (×5): qty 2

## 2013-06-24 MED ORDER — ACETAMINOPHEN 325 MG PO TABS: 650.0000 mg | ORAL_TABLET | ORAL | Status: DC | PRN

## 2013-06-24 MED ORDER — PENICILLIN G POTASSIUM 5000000 UNITS IJ SOLR
5.0000 10*6.[IU] | Freq: Once | INTRAVENOUS | Status: AC
  Administered 2013-06-24: 5 10*6.[IU] via INTRAVENOUS
  Filled 2013-06-24: qty 5

## 2013-06-24 MED ORDER — LACTATED RINGERS IV SOLN
INTRAVENOUS | Status: DC
  Administered 2013-06-24: 900 mL via INTRAVENOUS
  Administered 2013-06-25 (×2): via INTRAVENOUS

## 2013-06-24 MED ORDER — CITRIC ACID-SODIUM CITRATE 334-500 MG/5ML PO SOLN: 30.0000 mL | ORAL | Status: DC | PRN

## 2013-06-24 MED ORDER — OXYCODONE-ACETAMINOPHEN 5-325 MG PO TABS: 1.0000 | ORAL_TABLET | ORAL | Status: DC | PRN

## 2013-06-24 MED ORDER — OXYTOCIN 40 UNITS IN LACTATED RINGERS INFUSION - SIMPLE MED
62.5000 mL/h | INTRAVENOUS | Status: DC
  Administered 2013-06-25: 62.5 mL/h via INTRAVENOUS
  Filled 2013-06-24 (×2): qty 1000

## 2013-06-24 MED ORDER — TERBUTALINE SULFATE 1 MG/ML IJ SOLN: 0.2500 mg | Freq: Once | INTRAMUSCULAR | Status: AC | PRN

## 2013-06-24 NOTE — Progress Notes (Signed)
[name] is a 33 y.o. G1P0 at 5471w0d by admitted for induction of labor due to gestational Hypertension.  Subjective: Feeling well, feeling contractions but not pain   Objective: BP 124/86  Pulse 90  Temp(Src) 98.5 F (36.9 C) (Axillary)  Resp 18  Ht 5\' 9"  (1.753 m)  Wt 137.44 kg (303 lb)  BMI 44.72 kg/m2  LMP 09/17/2012      FHT:  FHR: 145 bpm, variability: moderate,  accelerations:  Present,  decelerations:  Absent UC:   regular, every 3 minutes SVE:   Dilation: 1 Effacement (%): 80 Station: -2 Exam by:: Dr Reola CalkinsBeck  Labs: Lab Results  Component Value Date   WBC 7.3 06/24/2013   HGB 11.3* 06/24/2013   HCT 35.0* 06/24/2013   MCV 84.7 06/24/2013   PLT 170 06/24/2013    Assessment / Plan: Induction for gHTN, s/p cytotec x1  Labor: Progressing normally, FB placed at 1pm Preeclampsia:  no signs or symptoms of toxicity Fetal Wellbeing:  Category I Pain Control:  Labor support without medications I/D:  GBS pos - on PCN Anticipated MOD:  NSVD  [name], [name] 06/24/2013, 1:20 PM

## 2013-06-24 NOTE — Progress Notes (Signed)
[name] is a 33 y.o. G1P0 at 1243w0d admitted for induction of labor due to gestational Hypertension.  Subjective: Feeling more pain with contractions. Requesting more fentanyl.  Objective: BP 144/92  Pulse 70  Temp(Src) 98.5 F (36.9 C) (Oral)  Resp 20  Ht 5\' 9"  (1.753 m)  Wt 137.44 kg (303 lb)  BMI 44.72 kg/m2  LMP 09/17/2012     FHT:  FHR: 145 bpm, variability: moderate,  accelerations:  Present,  decelerations:  Absent UC:  Difficulty to trace on side, previously irregular, every 2-5 minutes SVE:   Dilation: 1 Effacement (%): 80 Station: -2 Exam by:: Dr Reola CalkinsBeck  Labs: Lab Results  Component Value Date   WBC 7.3 06/24/2013   HGB 11.3* 06/24/2013   HCT 35.0* 06/24/2013   MCV 84.7 06/24/2013   PLT 170 06/24/2013    Assessment / Plan: induction for gestational hypertension, foley since 1pm  Labor: Progressing normally, starting low dose pit while foley in place Preeclampsia:  no signs or symptoms of toxicity Fetal Wellbeing:  Category I Pain Control:  Fentanyl I/D:  GBS pos on PCN Anticipated MOD:  NSVD  Beverely Lowdamo, Gurpreet Mikhail 06/24/2013, 7:18 PM

## 2013-06-24 NOTE — H&P (Signed)
[name] is a 33 y.o. female presenting for IOL for gHTN. Maternal Medical History:  RAviva Signseason for admission: Nausea.      [name] is a 33 y.o. G1P0, at 5222w0d presenting for IOL for gHTN. [facility] and has had an uneventful pregnancy with the exception of elevated BPs. She denies vaginal bleeding, LOS, abdominal cramping, and reports good fetal movement. She reports continued nausea throughout the pregnancy. She is expecting a girl and plans to breast feed. She anticipates IV fentanyl and an epidural for pain control. Her mother is present in the room and will help mother/baby after discharge.   Medications: prenatal vitamins, tylenol, progesterone Allergies: NKDA  Medical History: n/a Family History: DM (MGM), HTN (MGM) SH: she denies smoking, alcohol, illicit drug use; mother will help her after discharge; works at [employer]   OB History   Grav Para Term Preterm Abortions TAB SAB Ect Mult Living   1              Past Medical History  Diagnosis Date  . Anxiety    Past Surgical History  Procedure Laterality Date  . Wisdom tooth extraction  2008   Family History: family history is not on file. Social History:  reports that she has never smoked. She has never used smokeless tobacco. She reports that she does not drink alcohol or use illicit drugs.   Prenatal Transfer Tool  Maternal Diabetes: No Genetic Screening: Normal Maternal Ultrasounds/Referrals: Normal Fetal Ultrasounds or other Referrals:  None Maternal Substance Abuse:  No Significant Maternal Medications:  None Significant Maternal Lab Results:  Lab values include: Group B Strep positive Other Comments:  proteinuria, 24-hr urine=168  Review of Systems  Constitutional: Negative for fever and chills.  HENT: Negative for congestion and sore throat.   Eyes: Negative for blurred vision and double vision.  Respiratory: Negative for cough and shortness of breath.    Cardiovascular: Negative for chest pain and palpitations.  Gastrointestinal: Positive for nausea. Negative for heartburn, vomiting, abdominal pain, diarrhea and constipation.  Genitourinary: Negative for dysuria and urgency.  Musculoskeletal: Negative for myalgias.  Neurological: Negative for dizziness, tingling, weakness and headaches.    Dilation: Fingertip Effacement (%): 60 Station: -2 Exam by:: Dherr rn Blood pressure 139/81, pulse 92, last menstrual period 09/17/2012. Exam Physical Exam  Constitutional: She is oriented to person, place, and time. She appears well-developed and well-nourished. No distress.  Eyes: EOM are normal.  Neck: Normal range of motion. No JVD present.  Cardiovascular: Normal rate, regular rhythm and normal heart sounds.   Respiratory: Effort normal and breath sounds normal.  GI: Soft. Bowel sounds are normal. There is no tenderness.  Musculoskeletal: She exhibits no edema.  Neurological: She is alert and oriented to person, place, and time.  Skin: Skin is warm and dry.  Psychiatric: She has a normal mood and affect.    Prenatal labs: ABO, Rh: AB/Positive/-- (06/30 0000) Antibody: Negative (06/30 0000) Rubella: Immune (06/30 0000) RPR: NON REAC (11/07 1121)  HBsAg: Negative (06/30 0000)  HIV: Non-reactive (06/30 0000)  GBS: Positive (01/14 0000)   Assessment/Plan:  [name] is a 33 y.o. G1P0 at 6522w0d presenting for IOL for gHTN. She plans for fentanyl and epidural for pain control. She is comfortable now and understands her course of induction. She is GBS positive. Recent labs have indicated proteinuria.  Duane Bostonuke, Angela N 06/24/2013, 8:05 AM  I have seen and examined this patient and agree with above  documentation in the PA student's note.   [name] is a 33 yo G1 @ [redacted]w[redacted]d who presents today for IOL for gHTN.  Pt denies ctx, lof, vb. +FM.  No HA, vision changes, RUQ pain.    1) Admit to L&D - routine orders  2) IOL - unfavorable  bishops - start with cervical ripening with cytotec PV  3) gHTN - BP normal currently - will send r/o labs - monitor pressures   4) FWB - cat I tracing - GBS +. Start PCN  5) anticipate SVD  Rulon Abide, M.D. [facility] Fellow 06/24/2013 10:48 AM

## 2013-06-25 ENCOUNTER — Inpatient Hospital Stay (HOSPITAL_COMMUNITY): Payer: Medicaid Other | Admitting: Anesthesiology

## 2013-06-25 ENCOUNTER — Encounter (HOSPITAL_COMMUNITY): Payer: Medicaid Other | Admitting: Anesthesiology

## 2013-06-25 ENCOUNTER — Encounter (HOSPITAL_COMMUNITY): Payer: Self-pay

## 2013-06-25 DIAGNOSIS — O139 Gestational [pregnancy-induced] hypertension without significant proteinuria, unspecified trimester: Secondary | ICD-10-CM

## 2013-06-25 LAB — CBC
HCT: 34.7 % — ABNORMAL LOW (ref 36.0–46.0)
Hemoglobin: 11.6 g/dL — ABNORMAL LOW (ref 12.0–15.0)
MCH: 28 pg (ref 26.0–34.0)
MCHC: 33.4 g/dL (ref 30.0–36.0)
MCV: 83.8 fL (ref 78.0–100.0)
Platelets: 180 10*3/uL (ref 150–400)
RBC: 4.14 MIL/uL (ref 3.87–5.11)
RDW: 13.5 % (ref 11.5–15.5)
WBC: 10.7 10*3/uL — ABNORMAL HIGH (ref 4.0–10.5)

## 2013-06-25 MED ORDER — LACTATED RINGERS IV SOLN
500.0000 mL | Freq: Once | INTRAVENOUS | Status: AC
  Administered 2013-06-25: 500 mL via INTRAVENOUS

## 2013-06-25 MED ORDER — OXYCODONE-ACETAMINOPHEN 5-325 MG PO TABS
1.0000 | ORAL_TABLET | ORAL | Status: DC | PRN
  Administered 2013-06-26 (×4): 1 via ORAL
  Filled 2013-06-25 (×4): qty 1

## 2013-06-25 MED ORDER — DIPHENHYDRAMINE HCL 25 MG PO CAPS: 25.0000 mg | ORAL_CAPSULE | Freq: Four times a day (QID) | ORAL | Status: DC | PRN

## 2013-06-25 MED ORDER — ONDANSETRON HCL 4 MG/2ML IJ SOLN
4.0000 mg | INTRAMUSCULAR | Status: DC | PRN
Start: 2013-06-25 — End: 2013-06-27

## 2013-06-25 MED ORDER — ONDANSETRON HCL 4 MG PO TABS: 4.0000 mg | ORAL_TABLET | ORAL | Status: DC | PRN

## 2013-06-25 MED ORDER — EPHEDRINE 5 MG/ML INJ
10.0000 mg | INTRAVENOUS | Status: DC | PRN
  Filled 2013-06-25: qty 4

## 2013-06-25 MED ORDER — PRENATAL MULTIVITAMIN CH
1.0000 | ORAL_TABLET | Freq: Every day | ORAL | Status: DC
  Administered 2013-06-26 – 2013-06-27 (×2): 1 via ORAL
  Filled 2013-06-25 (×2): qty 1

## 2013-06-25 MED ORDER — MISOPROSTOL 200 MCG PO TABS
1000.0000 ug | ORAL_TABLET | Freq: Once | ORAL | Status: AC
  Administered 2013-06-25: 1000 ug via RECTAL

## 2013-06-25 MED ORDER — LANOLIN HYDROUS EX OINT: TOPICAL_OINTMENT | CUTANEOUS | Status: DC | PRN

## 2013-06-25 MED ORDER — FENTANYL 2.5 MCG/ML BUPIVACAINE 1/10 % EPIDURAL INFUSION (WH - ANES)
INTRAMUSCULAR | Status: DC | PRN
  Administered 2013-06-25: 14 mL/h via EPIDURAL

## 2013-06-25 MED ORDER — SENNOSIDES-DOCUSATE SODIUM 8.6-50 MG PO TABS
2.0000 | ORAL_TABLET | ORAL | Status: DC
  Administered 2013-06-26 (×2): 2 via ORAL
  Filled 2013-06-25 (×2): qty 2

## 2013-06-25 MED ORDER — EPHEDRINE 5 MG/ML INJ
10.0000 mg | INTRAVENOUS | Status: DC | PRN
Start: 2013-06-25 — End: 2013-06-25

## 2013-06-25 MED ORDER — FENTANYL 2.5 MCG/ML BUPIVACAINE 1/10 % EPIDURAL INFUSION (WH - ANES)
14.0000 mL/h | INTRAMUSCULAR | Status: DC | PRN
Start: 2013-06-25 — End: 2013-06-25
  Administered 2013-06-25: 14 mL/h via EPIDURAL
  Filled 2013-06-25 (×2): qty 125

## 2013-06-25 MED ORDER — GENTAMICIN SULFATE 40 MG/ML IJ SOLN
240.0000 mg | Freq: Three times a day (TID) | INTRAVENOUS | Status: DC
  Filled 2013-06-25 (×2): qty 6

## 2013-06-25 MED ORDER — TETANUS-DIPHTH-ACELL PERTUSSIS 5-2.5-18.5 LF-MCG/0.5 IM SUSP: 0.5000 mL | Freq: Once | INTRAMUSCULAR | Status: DC

## 2013-06-25 MED ORDER — IBUPROFEN 600 MG PO TABS
600.0000 mg | ORAL_TABLET | Freq: Four times a day (QID) | ORAL | Status: DC
  Administered 2013-06-26 – 2013-06-27 (×7): 600 mg via ORAL
  Filled 2013-06-25 (×7): qty 1

## 2013-06-25 MED ORDER — ZOLPIDEM TARTRATE 5 MG PO TABS: 5.0000 mg | ORAL_TABLET | Freq: Every evening | ORAL | Status: DC | PRN

## 2013-06-25 MED ORDER — WITCH HAZEL-GLYCERIN EX PADS: 1.0000 "application " | MEDICATED_PAD | CUTANEOUS | Status: DC | PRN

## 2013-06-25 MED ORDER — DIPHENHYDRAMINE HCL 50 MG/ML IJ SOLN: 12.5000 mg | INTRAMUSCULAR | Status: DC | PRN

## 2013-06-25 MED ORDER — BENZOCAINE-MENTHOL 20-0.5 % EX AERO
1.0000 "application " | INHALATION_SPRAY | CUTANEOUS | Status: DC | PRN
  Administered 2013-06-26: 1 via TOPICAL
  Filled 2013-06-25: qty 56

## 2013-06-25 MED ORDER — PHENYLEPHRINE 40 MCG/ML (10ML) SYRINGE FOR IV PUSH (FOR BLOOD PRESSURE SUPPORT): 80.0000 ug | PREFILLED_SYRINGE | INTRAVENOUS | Status: DC | PRN

## 2013-06-25 MED ORDER — MISOPROSTOL 200 MCG PO TABS
ORAL_TABLET | ORAL | Status: AC
  Administered 2013-06-25: 1000 ug via RECTAL
  Filled 2013-06-25: qty 5

## 2013-06-25 MED ORDER — SIMETHICONE 80 MG PO CHEW: 80.0000 mg | CHEWABLE_TABLET | ORAL | Status: DC | PRN

## 2013-06-25 MED ORDER — DIBUCAINE 1 % RE OINT: 1.0000 "application " | TOPICAL_OINTMENT | RECTAL | Status: DC | PRN

## 2013-06-25 MED ORDER — PHENYLEPHRINE 40 MCG/ML (10ML) SYRINGE FOR IV PUSH (FOR BLOOD PRESSURE SUPPORT)
80.0000 ug | PREFILLED_SYRINGE | INTRAVENOUS | Status: DC | PRN
Start: 2013-06-25 — End: 2013-06-25
  Filled 2013-06-25: qty 10

## 2013-06-25 MED ORDER — ACETAMINOPHEN 500 MG PO TABS
1000.0000 mg | ORAL_TABLET | Freq: Four times a day (QID) | ORAL | Status: DC | PRN
  Administered 2013-06-25: 1000 mg via ORAL
  Filled 2013-06-25: qty 2

## 2013-06-25 MED ORDER — LIDOCAINE HCL (PF) 1 % IJ SOLN
INTRAMUSCULAR | Status: DC | PRN
  Administered 2013-06-25 (×3): 5 mL

## 2013-06-25 NOTE — Progress Notes (Signed)
Reola CalkinsBeck, MD, notified of FHR baseline change from 150 to 175, SVE, and maternal temperature of 99.0 orally and 99.6 axillary. No orders received.

## 2013-06-25 NOTE — Progress Notes (Signed)
[name] is a 33 y.o. G1P0 at 912w1d by LMP consistent w/ US admitted for induction of labor due to Gestational Hypertension.  Subjective: Patient just received epidural. Feeling relief.  Objective: BP 114/54  Pulse 80  Temp(Src) 98.3 F (36.8 C) (Oral)  Resp 18  Ht 5\' 9"  (1.753 m)  Wt 137.44 kg (303 lb)  BMI 44.72 kg/m2  SpO2 99%  LMP 09/17/2012      FHT:  FHR: 140-150 bpm, variability: moderate,  accelerations:  Present,  decelerations:  Absent UC:   Patient reports q4 min. Lying on side - not picking up on EFW  SVE:   Dilation: 4.5 Effacement (%): 100 Station: -1 Exam by:: Lorenda PeckAshley Bass RN  Labs: Lab Results  Component Value Date   WBC 10.7* 06/25/2013   HGB 11.6* 06/25/2013   HCT 34.7* 06/25/2013   MCV 83.8 06/25/2013   PLT 180 06/25/2013    Assessment / Plan:  Induction for gestational hypertension, status post foley since 2am.   Labor: Progressing normally, continue pit  Preeclampsia: no signs or symptoms of toxicity  Fetal Wellbeing: Category I  Pain Control: Epidural I/D: GBS pos on PCN  Anticipated MOD: NSVD    Quincy SimmondsFeeney, Jannice Beitzel L 06/25/2013, 6:44 AM

## 2013-06-25 NOTE — Anesthesia Procedure Notes (Signed)
Epidural Patient location during procedure: OB  Staffing Anesthesiologist: Phillips GroutARIGNAN, Khristian Phillippi Performed by: anesthesiologist   Preanesthetic Checklist Completed: patient identified, site marked, surgical consent, pre-op evaluation, timeout performed, IV checked, risks and benefits discussed and monitors and equipment checked  Epidural Patient position: sitting Prep: ChloraPrep Patient monitoring: heart rate, continuous pulse ox and blood pressure Approach: midline Injection technique: LOR saline  Needle:  Needle type: Tuohy  Needle gauge: 17 G Needle length: 9 cm and 9 Needle insertion depth: 9 cm Catheter type: closed end flexible Catheter size: 20 Guage Catheter at skin depth: 14 cm Test dose: negative  Assessment Events: blood not aspirated, injection not painful, no injection resistance, negative IV test and no paresthesia  Additional Notes   Patient tolerated the insertion well without complications.

## 2013-06-25 NOTE — Progress Notes (Signed)
[name] is a 33 y.o. G1P0 at 4081w1d admitted for induction of labor due to gestational Hypertension.  Subjective: Feeling comfortable with epidural but now with nausea and vomiting, better after zofran   Objective: BP 129/82  Pulse 93  Temp(Src) 98.5 F (36.9 C) (Oral)  Resp 20  Ht 5\' 9"  (1.753 m)  Wt 137.44 kg (303 lb)  BMI 44.72 kg/m2  SpO2 99%  LMP 09/17/2012      FHT:  FHR: 145 bpm, variability: moderate,  accelerations:  Present,  decelerations:  Absent UC:   regular, every 2-3 minutes SVE:   Dilation: 4.5 Effacement (%): 80 Station: -2 Exam by:: Arlis Portalympio, Med Student  Labs: Lab Results  Component Value Date   WBC 10.7* 06/25/2013   HGB 11.6* 06/25/2013   HCT 34.7* 06/25/2013   MCV 83.8 06/25/2013   PLT 180 06/25/2013    Assessment / Plan: Induction of labor due to gestational hypertension,  progressing well on pitocin  Labor: Progressing normally on pitocin. AROM at 10am and placement of IUPC Preeclampsia:  no signs or symptoms of toxicity Fetal Wellbeing:  Category I Pain Control:  Epidural I/D:  GBS pos on PCN Anticipated MOD:  NSVD  Beverely Lowdamo, Elena 06/25/2013, 10:11 AM

## 2013-06-25 NOTE — Anesthesia Preprocedure Evaluation (Signed)
Anesthesia Evaluation  Patient identified by MRN, date of birth, ID band Patient awake    Reviewed: Allergy & Precautions, H&P , NPO status , Patient's Chart, lab work & pertinent test results  History of Anesthesia Complications Negative for: history of anesthetic complications  Airway Mallampati: II TM Distance: >3 FB Neck ROM: full    Dental no notable dental hx. (+) Teeth Intact   Pulmonary neg pulmonary ROS,  breath sounds clear to auscultation  Pulmonary exam normal       Cardiovascular hypertension, negative cardio ROS  Rhythm:regular Rate:Normal     Neuro/Psych negative neurological ROS  negative psych ROS   GI/Hepatic negative GI ROS, Neg liver ROS,   Endo/Other  negative endocrine ROSMorbid obesity  Renal/GU negative Renal ROS  negative genitourinary   Musculoskeletal   Abdominal Normal abdominal exam  (+)   Peds  Hematology negative hematology ROS (+)   Anesthesia Other Findings   Reproductive/Obstetrics (+) Pregnancy                           Anesthesia Physical Anesthesia Plan  ASA: II  Anesthesia Plan: Epidural   Post-op Pain Management:    Induction:   Airway Management Planned:   Additional Equipment:   Intra-op Plan:   Post-operative Plan:   Informed Consent: I have reviewed the patients History and Physical, chart, labs and discussed the procedure including the risks, benefits and alternatives for the proposed anesthesia with the patient or authorized representative who has indicated his/her understanding and acceptance.     Plan Discussed with:   Anesthesia Plan Comments:         Anesthesia Quick Evaluation

## 2013-06-25 NOTE — Progress Notes (Signed)
[name] is a 33 y.o. G1P0 at 5112w1d admitted for induction of labor due to Hypertension.  Subjective: Feeling discomfort.  Foley bulb out, cramping decreased  Objective: BP 114/54  Pulse 80  Temp(Src) 98.3 F (36.8 C) (Oral)  Resp 18  Ht 5\' 9"  (1.753 m)  Wt 137.44 kg (303 lb)  BMI 44.72 kg/m2  SpO2 99%  LMP 09/17/2012      FHT:  FHR: 130 bpm, variability: moderate,  accelerations:  Present,  decelerations:  Absent  SVE:    Dilation: 4 Effacement: 90 Station: -2  Labs: Lab Results  Component Value Date   WBC 10.7* 06/25/2013   HGB 11.6* 06/25/2013   HCT 34.7* 06/25/2013   MCV 83.8 06/25/2013   PLT 180 06/25/2013    Assessment / Plan:  induction for gestational hypertension, status post foley since 2am    Labor: Progressing normally, continue pit Preeclampsia: no signs or symptoms of toxicity  Fetal Wellbeing: Category I  Pain Control: Fentanyl  - anticipate epidural I/D: GBS pos on PCN  Anticipated MOD: NSVD    Quincy SimmondsFeeney, Ervin Hensley L 06/25/2013, 6:37 AM

## 2013-06-26 LAB — CBC
HCT: 30.6 % — ABNORMAL LOW (ref 36.0–46.0)
Hemoglobin: 10.1 g/dL — ABNORMAL LOW (ref 12.0–15.0)
MCH: 28 pg (ref 26.0–34.0)
MCHC: 33 g/dL (ref 30.0–36.0)
MCV: 84.8 fL (ref 78.0–100.0)
Platelets: 164 10*3/uL (ref 150–400)
RBC: 3.61 MIL/uL — ABNORMAL LOW (ref 3.87–5.11)
RDW: 13.4 % (ref 11.5–15.5)
WBC: 12.3 10*3/uL — AB (ref 4.0–10.5)

## 2013-06-26 NOTE — Anesthesia Postprocedure Evaluation (Signed)
  Anesthesia Post-op Note  Anesthesia Post Note  Patient: [name]  Procedure(s) Performed: * No procedures listed *  Anesthesia type: Epidural  Patient location: Mother/Baby  Post pain: Pain level controlled  Post assessment: Post-op Vital signs reviewed  Last Vitals:  Filed Vitals:   06/26/13 0625  BP: 122/84  Pulse: 79  Temp: 36.5 C  Resp: 19    Post vital signs: Reviewed  Level of consciousness:alert  Complications: No apparent anesthesia complications

## 2013-06-26 NOTE — Progress Notes (Signed)
UR completed 

## 2013-06-26 NOTE — Discharge Instructions (Signed)

## 2013-06-26 NOTE — Discharge Summary (Addendum)
  Obstetric Discharge Summary Reason for Admission: induction of labor for gHTN Prenatal Procedures: NST Intrapartum Procedures: spontaneous vaginal delivery Postpartum Procedures: none Complications-Operative and Postpartum: 2nd degree perineal laceration Hemoglobin  Date Value Ref Range Status  06/26/2013 10.1* 12.0 - 15.0 g/dL Final  1/61/09606/30/2014 45.412.5   Final     HCT  Date Value Ref Range Status  06/26/2013 30.6* 36.0 - 46.0 % Final   Hospital Course: Aviva SignsDeneen Pastorino is a 33 y.o. G1P1001 admitted for IOL due to gHTN. Intra-partum course was complicated by Chorioamnionitis with imminent delivery after diagnosis. Pt did not require Antibiotics. Uncomplicated Post partum Course.  Physical Exam:  General: alert, cooperative and no distress Lochia: appropriate Uterine Fundus: firm DVT Evaluation: No evidence of DVT seen on physical exam. Negative Homan's sign.  Discharge Diagnoses: Term Pregnancy-delivered  Discharge Information: Date: 06/27/2013 Activity: unrestricted Diet: routine Medications: Ibuprofen, Colace and Iron Condition: stable Instructions: refer to practice specific booklet Discharge to: home Contraception: undecided, counseled about LARC and other options Follow-up Information   Follow up with [facility]. Schedule an appointment as soon as possible for a visit in 4 weeks.   Contact information:   [address] [place] [id number]       Newborn Data: Live born female  Birth Weight: 7 lb 15 oz (3600 g) APGAR: 8, 9  Home with mother. Breastfeeding.  Beverely Lowdamo, Elena 06/27/2013, 8:33 AM  I have seen and examined this patient and I agree with the above. Cam HaiSHAW, KIMBERLY 8:44 AM 06/27/2013

## 2013-06-26 NOTE — Discharge Summary (Signed)
I have seen and examined this patient and agree the above assessment. [name],Bayan Kushnir 06/26/2013 3:29 PM

## 2013-06-26 NOTE — Progress Notes (Signed)
Post Partum Day 1 Subjective: Pt is doing well, complains of mild abdominal cramping, back pain and perineal pain. Bleeding is less than yesterday, like a 'heavy period'. Denies feeling feverish, states had chills at 5 am today while getting up to use the bathroom. Voiding and passing gas, no BM yet Up ad lib, tolerating P.O  Desires no Contraception, pt denied counselling Breastfeeding   Objective: Blood pressure 122/84, pulse 79, temperature 97.7 F (36.5 C), temperature source Oral, resp. rate 19, height 5\' 9"  (1.753 m), weight 137.44 kg (303 lb), last menstrual period 09/17/2012, SpO2 99.00%, unknown if currently breastfeeding.  Physical Exam:  General: alert, cooperative and no distress Lochia: appropriate Uterine Fundus: firm DVT Evaluation: No evidence of DVT seen on physical exam. Negative Homan's sign.   Recent Labs  06/25/13 0345 06/26/13 0615  HGB 11.6* 10.1*  HCT 34.7* 30.6*    Assessment/Plan: Plan for discharge tomorrow   LOS: 2 days   [name] 06/26/2013, 8:00 AM    I have seen and examined this patient and agree the above assessment. [name],[name] 06/26/2013 3:22 PM

## 2013-06-27 MED ORDER — OXYCODONE-ACETAMINOPHEN 5-325 MG PO TABS: 1.0000 | ORAL_TABLET | ORAL | Status: DC | PRN

## 2013-06-27 MED ORDER — IBUPROFEN 600 MG PO TABS: 600.0000 mg | ORAL_TABLET | Freq: Four times a day (QID) | ORAL | Status: AC

## 2013-06-29 ENCOUNTER — Telehealth: Payer: Self-pay | Admitting: *Deleted

## 2013-06-29 MED ORDER — PRENATAL MULTIVITAMIN CH: 1.0000 | ORAL_TABLET | Freq: Every day | ORAL | Status: AC

## 2013-06-29 NOTE — Telephone Encounter (Signed)
Patient called requesting a refill on PNV. Rx sent to pharmacy.

## 2013-06-30 NOTE — H&P (Signed)
Pt seen and examined.  Agree with above.

## 2013-07-11 NOTE — Discharge Summary (Signed)
`````  Attestation of Attending Supervision of Advanced Practitioner: Evaluation and management procedures were performed by the PA/NP/CNM/OB Fellow under my supervision/collaboration. Chart reviewed and agree with management and plan.  Henrry Feil V 07/11/2013 7:50 PM

## 2013-07-14 ENCOUNTER — Telehealth: Payer: Self-pay | Admitting: *Deleted

## 2013-07-14 DIAGNOSIS — L0292 Furuncle, unspecified: Secondary | ICD-10-CM

## 2013-07-14 MED ORDER — BACITRACIN 500 UNIT/GM EX OINT
1.0000 | TOPICAL_OINTMENT | Freq: Two times a day (BID) | CUTANEOUS | Status: AC
Start: 2013-07-14 — End: ?

## 2013-07-14 NOTE — Telephone Encounter (Signed)
Samona called and left a message stating she is having some tingling in her fingers and wants to speak with a nurse. Called Kiaja , per chart review and discussion she is now postpartum and breastfeeding. States it started yesterday and felt like she slept on it, but thinks she didn't ; states it hasn't gone away. We discussed it could be several things such as maybe she did sleep on it; could be carpal tunnel.  She denies color change in those fingers or coldness.  Discussed if this worsens to see her pcp or urgent care. Also c/o having a boil on her pubic near crease- similar to one she had on her breast in December. States She did not take the keflex then because worried about pregnant and taking it; states it went away. Wants to know if she can take antibiotics now.  States she has been doing warm soaks with no improvement for about a week now.   States it seems to be opening but has scant pus like discharge.   Discussed with Wynelle BourgeoisMarie Williams, CNM - and instructed patient to continue doing warm soaks 2-3 times a day.  Also may apply bacitracin bid. Call back if it doesn't get better within the next 2 weeks or worsens

## 2013-07-30 ENCOUNTER — Ambulatory Visit (INDEPENDENT_AMBULATORY_CARE_PROVIDER_SITE_OTHER): Payer: Medicaid Other | Admitting: Obstetrics and Gynecology

## 2013-07-30 ENCOUNTER — Encounter: Payer: Self-pay | Admitting: Obstetrics and Gynecology

## 2013-07-30 NOTE — Progress Notes (Signed)
  Subjective:     [name] is a 33 y.o. female G1P1001 who presents for a postpartum visit. She is 5 weeks postpartum following a spontaneous vaginal delivery. I have fully reviewed the prenatal and intrapartum course. The delivery was at 39.6 gestational weeks. Outcome: spontaneous vaginal delivery. Anesthesia: epidural. Postpartum course has been uncomplicated. Baby's course has been uncomplicated. Baby is feeding by breast. Bleeding no bleeding. Bowel function is normal. Bladder function is normal. Patient is not sexually active. Contraception method is abstinence. Postpartum depression screening: negative.  The following portions of the patient's history were reviewed and updated as appropriate: allergies, current medications, past family history, past medical history, past social history, past surgical history and problem list.Hx abnl Pap no tx 10 years ago, all normal q year [place] and [place].  Review of Systems Pertinent items are noted in HPI.   Objective:    BP 115/81  Pulse 80  Temp(Src) 98 F (36.7 C) (Oral)  Ht 5\' 8"  (1.727 m)  Breastfeeding? Yes  General:  alert, cooperative and no distress   Breasts:  negative  Lungs: clear to auscultation bilaterally  Heart:  regular rate and rhythm, S1, S2 normal, no murmur, click, rub or gallop  Abdomen: soft, non-tender; bowel sounds normal; no masses,  no organomegaly   Vulva:  left mons with 2 small furuncles 5 mm, not drainning or inflamed (pt using Bacitracin)  Vagina: normal vagina  Cervix:  no cervical motion tenderness  Corpus: normal size, contour, position, consistency, mobility, non-tender  Adnexa:  not evaluated  Rectal Exam: Not performed.       Perineal laceration well healed. Ut ULNS. Assessment:     5 wk  postpartum exam. Pap smear not done at today's visit.   Plan:    1. Contraception: abstinence 2. Has infor on contraception 3. Follow up in: 1 year or as needed. Probably will f/u in  place of residence - [name] or workplace - [name]

## 2013-07-30 NOTE — Patient Instructions (Signed)

## 2013-09-02 ENCOUNTER — Encounter: Payer: Self-pay | Admitting: *Deleted

## 2013-10-08 ENCOUNTER — Encounter: Payer: Self-pay | Admitting: *Deleted

## 2014-03-15 ENCOUNTER — Encounter: Payer: Self-pay | Admitting: Obstetrics and Gynecology

## 2015-08-18 IMAGING — US US OB FOLLOW-UP
1 series · 12 of 28 positions shown · non-contrast
Comparison: none

[Series 1: us ob follow-up · 0.32mm/px · 12 of 46 slices shown]
[im 2/46]
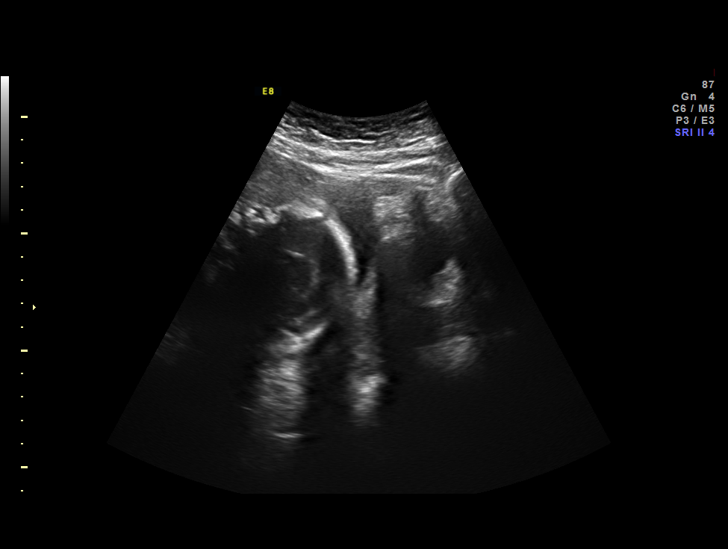
[im 6/46]
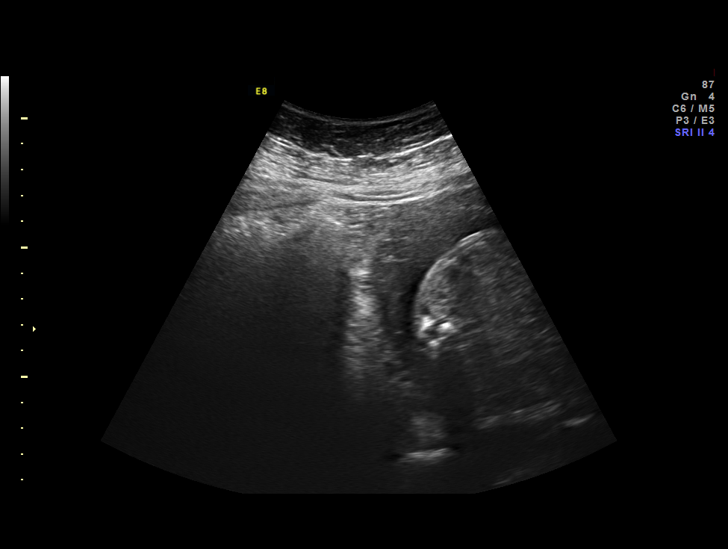
[im 9/46]
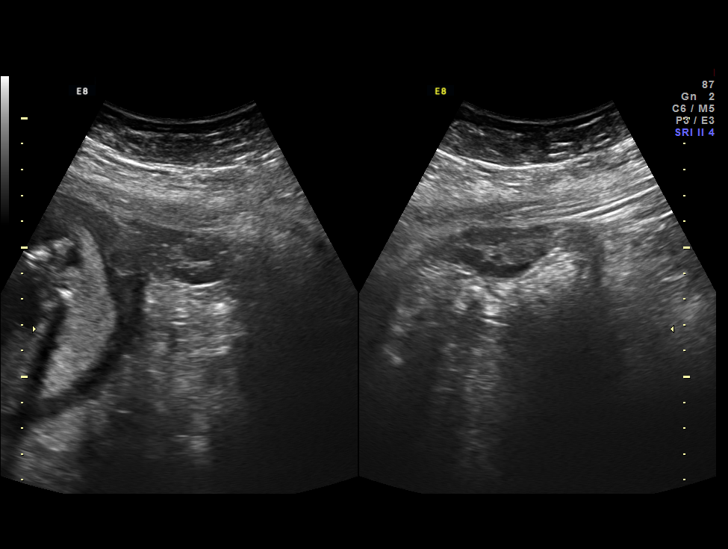
[im 14/46]
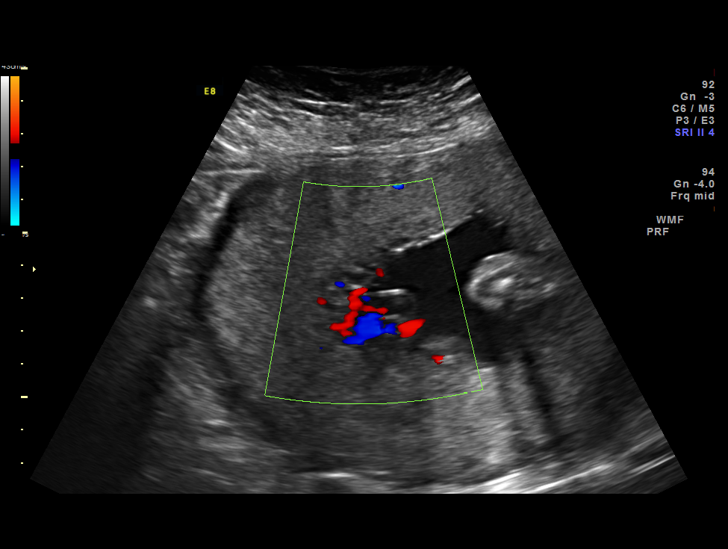
[im 17/46]
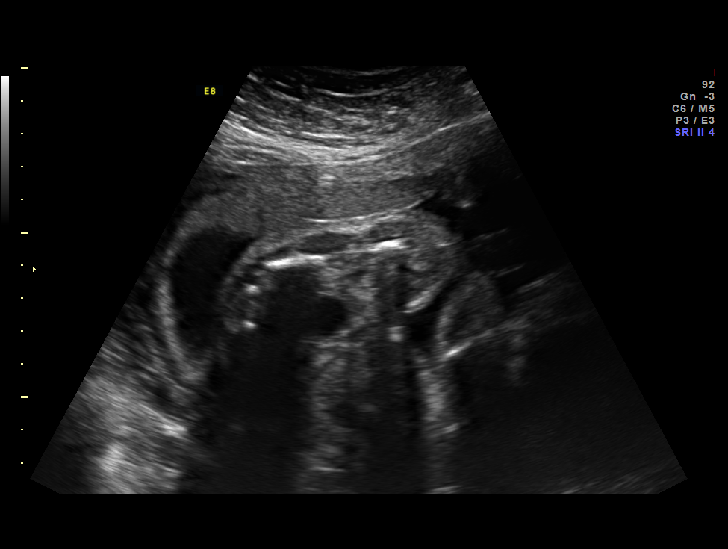
[im 21/46]
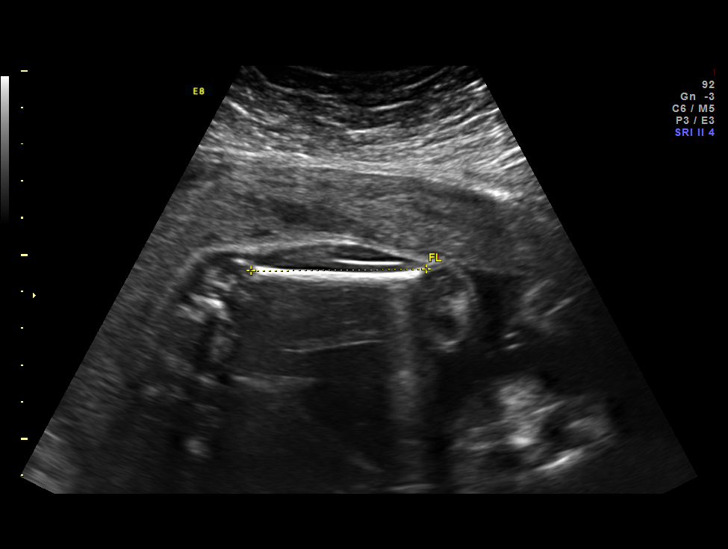
[im 26/46]
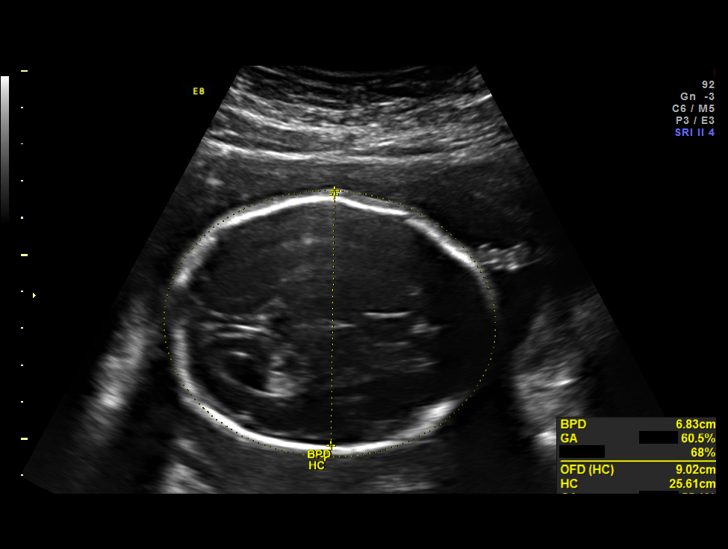
[im 29/46]
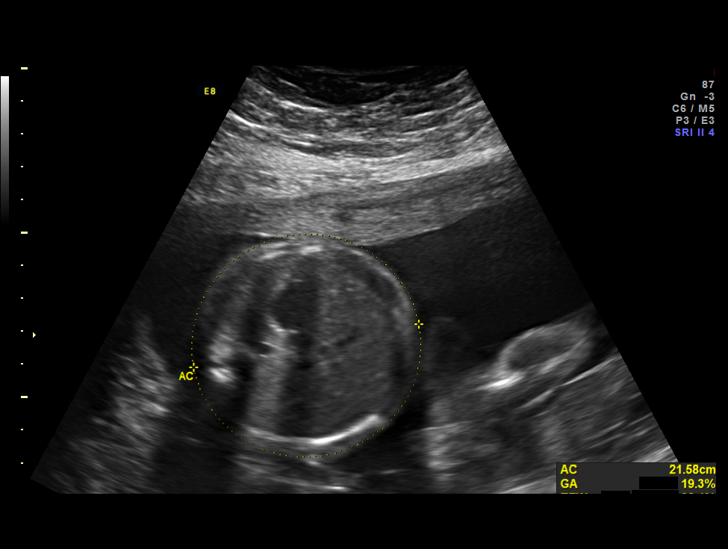
[im 32/46]
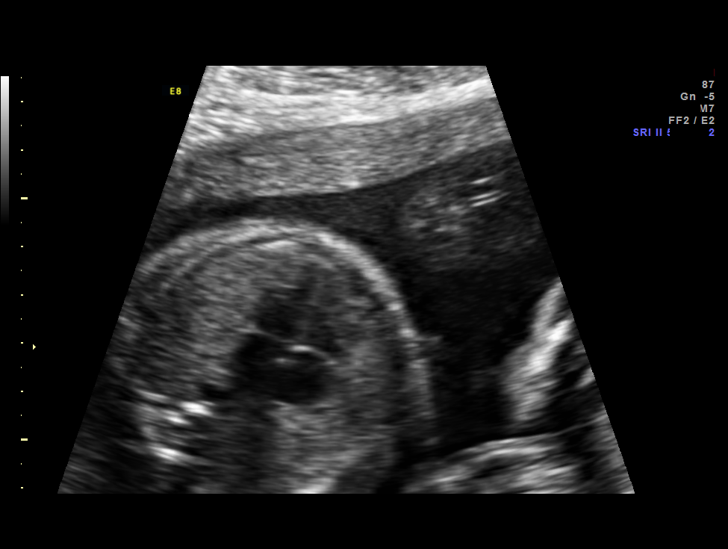
[im 37/46]
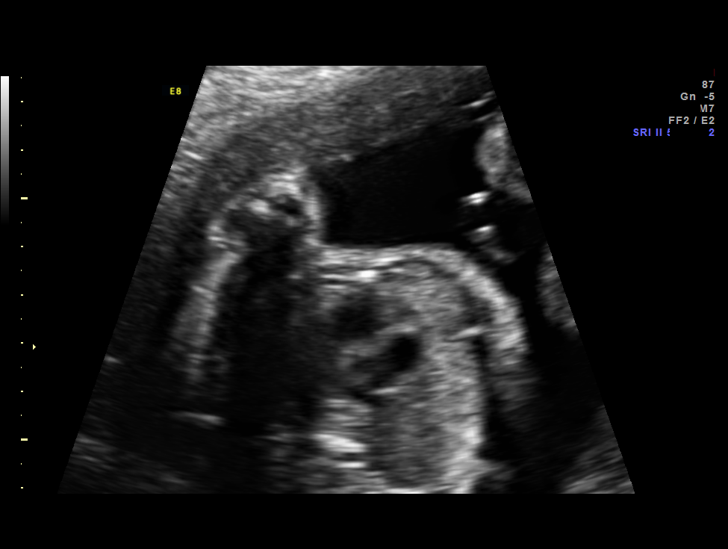
[im 41/46]
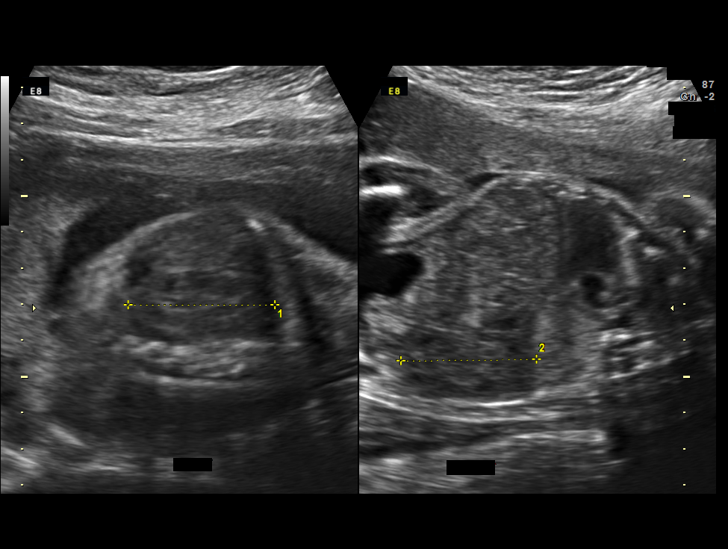
[im 44/46]
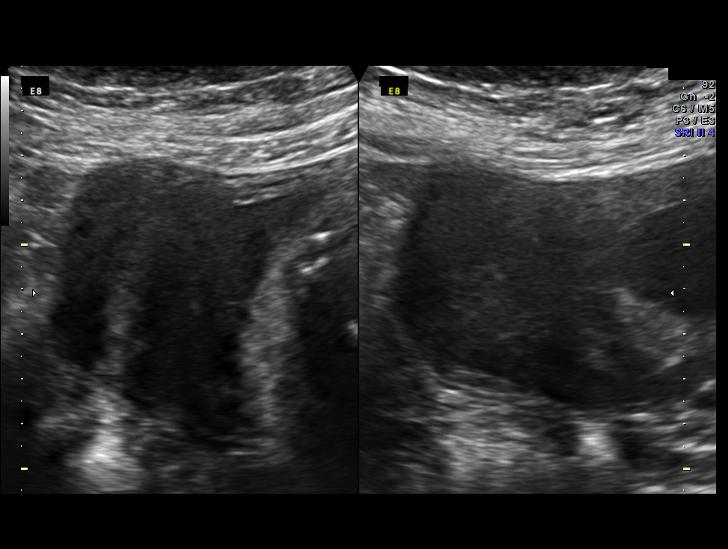

[12 of 28 positions shown; findings below may reference images not displayed]

OBSTETRICS REPORT
                      (Signed Final 03/24/2013 [DATE])

Service(s) Provided

 US OB FOLLOW UP                                       76816.1
Indications

 Maternal morbid obesity (404lbs)
 Follow-up incomplete fetal anatomic evaluation
Fetal Evaluation

 Num Of Fetuses:    1
 Fetal Heart Rate:  150                          bpm
 Cardiac Activity:  Observed
 Presentation:      Cephalic
 Placenta:          Posterior Fundal, above
                    cervical os
 P. Cord            Visualized, central
 Insertion:

 Amniotic Fluid
 AFI FV:      Subjectively within normal limits
                                             Larg Pckt:     6.7  cm
Biometry

 BPD:     68.1  mm     G. Age:  27w 3d                CI:         75.2   70 - 86
 OFD:     90.6  mm                                    FL/HC:      18.4   18.6 -

 HC:     255.2  mm     G. Age:  27w 5d       52  %    HC/AC:      1.17   1.05 -

 AC:     218.2  mm     G. Age:  26w 2d       25  %    FL/BPD:     68.9   71 - 87
 FL:      46.9  mm     G. Age:  25w 5d        9  %    FL/AC:      21.5   20 - 24
 HUM:       43  mm     G. Age:  25w 5d       19  %
 Est. FW:     912  gm           2 lb     40  %
Gestational Age

 LMP:           26w 6d        Date:  09/17/12                 EDD:   06/24/13
 U/S Today:     26w 5d                                        EDD:   06/25/13
 Best:          26w 6d     Det. By:  LMP  (09/17/12)          EDD:   06/24/13
Anatomy
 Cranium:          Appears normal         Aortic Arch:      Previously seen
 Fetal Cavum:      Previously seen        Ductal Arch:      Previously seen
 Ventricles:       Appears normal         Diaphragm:        Previously seen
 Choroid Plexus:   Previously seen        Stomach:          Appears normal, left
                                                            sided
 Cerebellum:       Previously seen        Abdomen:          Previously seen
 Posterior Fossa:  Previously seen        Abdominal Wall:   Previously seen
 Nuchal Fold:      Previously seen        Cord Vessels:     Previously seen
 Face:             Orbits and profile     Kidneys:          Appear normal
                   previously seen
 Lips:             Previously seen        Bladder:          Appears normal
 Heart:            Appears normal         Spine:            Previously seen
                   (4CH, axis, and
                   situs)
 RVOT:             Not well visualized    Lower             Previously seen
                                          Extremities:
 LVOT:             Appears normal         Upper             Previously seen
                                          Extremities:

 Other:  Nasal bone visualized. Heels visualized. Technically difficult due to
         maternal habitus and fetal position.
Cervix Uterus Adnexa

 Cervical Length:    3.2      cm

 Cervix:       Normal appearance by transabdominal scan.
 Uterus:       Single fibroid noted, see table below.

 Left Ovary:    No adnexal mass visualized.
 Right Ovary:   No adnexal mass visualized.
Myomas

 Site                     L(cm)      W(cm)       D(cm)      Location
 Right

 Blood Flow                  RI       PI       Comments

Impression

 IUP at 26+6 weeks
 Normal interval anatomy; anatomic survey complete except
 for RVOT
 Normal amniotic fluid volume
 Appropriate interval growth with EFW at the 40th %tile
Recommendations

 Follow-up ultrasound for growth in 4 weeks if unable to follow
 growth clinically

 questions or concerns.

## 2015-09-12 DIAGNOSIS — F330 Major depressive disorder, recurrent, mild: Secondary | ICD-10-CM | POA: Insufficient documentation

## 2015-09-12 DIAGNOSIS — F410 Panic disorder [episodic paroxysmal anxiety] without agoraphobia: Secondary | ICD-10-CM | POA: Insufficient documentation

## 2016-09-11 DIAGNOSIS — Z8639 Personal history of other endocrine, nutritional and metabolic disease: Secondary | ICD-10-CM | POA: Insufficient documentation

## 2016-09-14 DIAGNOSIS — E559 Vitamin D deficiency, unspecified: Secondary | ICD-10-CM | POA: Insufficient documentation

## 2018-03-16 ENCOUNTER — Other Ambulatory Visit: Payer: Self-pay

## 2018-03-16 ENCOUNTER — Emergency Department (HOSPITAL_BASED_OUTPATIENT_CLINIC_OR_DEPARTMENT_OTHER)
Admission: EM | Admit: 2018-03-16 | Discharge: 2018-03-16 | Disposition: A | Discharge status: Home / Self Care | Payer: Medicaid Other | Attending: Emergency Medicine | Admitting: Emergency Medicine

## 2018-03-16 ENCOUNTER — Encounter (HOSPITAL_BASED_OUTPATIENT_CLINIC_OR_DEPARTMENT_OTHER): Payer: Self-pay | Admitting: Emergency Medicine

## 2018-03-16 ENCOUNTER — Emergency Department (HOSPITAL_BASED_OUTPATIENT_CLINIC_OR_DEPARTMENT_OTHER): Payer: Medicaid Other

## 2018-03-16 DIAGNOSIS — S93602A Unspecified sprain of left foot, initial encounter: Secondary | ICD-10-CM | POA: Diagnosis not present

## 2018-03-16 DIAGNOSIS — Y999 Unspecified external cause status: Secondary | ICD-10-CM | POA: Insufficient documentation

## 2018-03-16 DIAGNOSIS — Y939 Activity, unspecified: Secondary | ICD-10-CM | POA: Insufficient documentation

## 2018-03-16 DIAGNOSIS — Z79899 Other long term (current) drug therapy: Secondary | ICD-10-CM | POA: Diagnosis not present

## 2018-03-16 DIAGNOSIS — X509XXA Other and unspecified overexertion or strenuous movements or postures, initial encounter: Secondary | ICD-10-CM | POA: Diagnosis not present

## 2018-03-16 DIAGNOSIS — S99922A Unspecified injury of left foot, initial encounter: Secondary | ICD-10-CM | POA: Diagnosis present

## 2018-03-16 DIAGNOSIS — Y929 Unspecified place or not applicable: Secondary | ICD-10-CM | POA: Diagnosis not present

## 2018-03-16 NOTE — ED Provider Notes (Signed)
[facility] EMERGENCY DEPARTMENT Provider Note  CSN: [id number] Arrival date & time: 03/16/18  1503    History   Chief Complaint Chief Complaint  Patient presents with  . Foot Injury    HPI [name] is a 37 y.o. female who presented to the ED for   Foot Injury   Incident onset: 5 days ago. The incident occurred at home. Injury mechanism: everted her ankle while walking. The pain is present in the left foot. The quality of the pain is described as aching. The pain is moderate. The pain has been fluctuating since onset. Pertinent negatives include no numbness, no inability to bear weight, no loss of motion, no muscle weakness, no loss of sensation and no tingling. She reports no foreign bodies present. The symptoms are aggravated by bearing weight. She has tried elevation, ice and rest for the symptoms. The treatment provided moderate relief.    Past Medical History:  Diagnosis Date  . Anxiety     Patient Active Problem List   Diagnosis Date Noted  . Vitamin D deficiency 09/14/2016  . History of vitamin D deficiency 09/11/2016  . Morbid obesity (HCC) 09/11/2016  . Mild episode of recurrent major depressive disorder (HCC) 09/12/2015  . Panic disorder 09/12/2015  . Gestational hypertension w/o significant proteinuria in 3rd trimester 06/24/2013  . Gestational hypertension without significant proteinuria in third trimester 06/10/2013  . Cervical shortening, antepartum condition or complication 04/28/2013  . Mental disorders of mother, antepartum 03/04/2013  . Supervision of normal first pregnancy 02/19/2013    Past Surgical History:  Procedure Laterality Date  . WISDOM TOOTH EXTRACTION  2008     OB History    Gravida  1   Para  1   Term  1   Preterm      AB      Living  1     SAB      TAB      Ectopic      Multiple      Live Births  1            Home Medications    Prior to Admission medications   Medication Sig Start Date End  Date Taking? Authorizing Provider  ALPRAZolam Prudy Feeler) 1 MG tablet Take by mouth. 08/27/17  Yes [provider]  gabapentin (NEURONTIN) 300 MG capsule Take by mouth. 08/27/17  Yes [provider]  propranolol (INDERAL) 20 MG tablet Take by mouth. 08/27/17  Yes [provider]  bacitracin 500 UNIT/GM ointment Apply 1 application topically 2 (two) times daily. 07/14/13   Aviva Signs, CNM  ibuprofen (ADVIL,MOTRIN) 600 MG tablet Take 1 tablet (600 mg total) by mouth every 6 (six) hours. 06/27/13   Abram Sander, MD  Prenatal Vit-Fe Fumarate-FA (PRENATAL MULTIVITAMIN) TABS tablet Take 1 tablet by mouth daily at 12 noon. 06/29/13   Adam Phenix, MD    Family History History reviewed. No pertinent family history.  Social History Social History   Tobacco Use  . Smoking status: Never Smoker  . Smokeless tobacco: Never Used  Substance Use Topics  . Alcohol use: No  . Drug use: No     Allergies   Buspirone; Bupropion; Doxycycline hyclate; Other; and Venlafaxine   Review of Systems Review of Systems  Constitutional: Negative for fever.  Musculoskeletal: Positive for arthralgias and joint swelling.  Skin: Negative for color change and wound.  Neurological: Negative for tingling, weakness and numbness.  All other systems reviewed and  are negative.    Physical Exam Updated Vital Signs BP 119/77 (BP Location: Left Arm)   Pulse 81   Temp 98.4 F (36.9 C) (Oral)   Resp 16   Ht 5\' 8"  (1.727 m)   Wt 135.2 kg   LMP 02/14/2018   SpO2 99%   BMI 45.31 kg/m   Physical Exam  Constitutional: She appears well-developed and well-nourished.  Cardiovascular:  Pulses:      Dorsalis pedis pulses are 2+ on the right side, and 2+ on the left side.       Posterior tibial pulses are 2+ on the right side, and 2+ on the left side.  Musculoskeletal:       Right foot: There is normal range of motion and no deformity.       Left foot: There is normal range of motion and  no deformity.       Feet:  Able to bear weight and ambulate without issue or assistance. Bony tenderness along the left 4th and 5th MTPs. No foot instability. Full ROM of ankles and toes bilaterally.  Neurological: She has normal strength. No sensory deficit. She exhibits normal muscle tone. Gait normal.  Reflex Scores:      Achilles reflexes are 2+ on the right side and 2+ on the left side. Skin: Skin is warm and intact. Capillary refill takes less than 2 seconds. No bruising noted. No erythema.  Nursing note and vitals reviewed.  ED Treatments / Results  Labs (all labs ordered are listed, but only abnormal results are displayed) Labs Reviewed - No data to display  EKG None  Radiology Dg Foot Complete Left  Result Date: 03/16/2018 CLINICAL DATA:  Foot injury 4 days ago.  Pain. EXAM: LEFT FOOT - COMPLETE 3+ VIEW COMPARISON:  None. FINDINGS: A hallux valgus deformity is identified. IMPRESSION: Negative. Electronically Signed   By: Gerome Sam III M.D   On: 03/16/2018 16:33    Procedures Procedures (including critical care time)  Medications Ordered in ED Medications - No data to display   Initial Impression / Assessment and Plan / ED Course  Triage vital signs and the nursing notes have been reviewed.  Pertinent labs & imaging results that were available during care of the patient were reviewed and considered in medical decision making (see chart for details). Clinical Course as of Mar 16 1640  Wynelle Link Mar 16, 2018  1637 Foot x-ray showed no fractures or dislocations.   [GM]    Clinical Course User Index [GM] Elita Dame, Sharyon Medicus, PA-C   Patient is in no distress and well appearing. Patient has full sensation in left foot. She also has full active and passive ROM. No deformities, decreased muscle tone or other abnormalities visualized. Neurovascular function is intact. Physical exam and x-rays are reassuring. There are no other physical exam findings or s/s that suggest an  underlying infectious or rheumatologic process that warrant further evaluation or intervention today.  Final Clinical Impressions(s) / ED Diagnoses  1. Left Foot Pain. Education provided on OTC and supportive treatment for pain relief and inflammation.  Dispo: Home. After thorough clinical evaluation, this patient is determined to be medically stable and can be safely discharged with the previously mentioned treatment and/or outpatient follow-up/referral(s). At this time, there are no other apparent medical conditions that require further screening, evaluation or treatment.   Final diagnoses:  Sprain of left foot, initial encounter    ED Discharge Orders    None  Dagoberto Ligas I, [place] 03/16/18 1644    Arby Barrette, MD 03/17/18 [phone]

## 2018-03-16 NOTE — Discharge Instructions (Addendum)
Your x-ray was normal and did not show any broken or dislocated bones. The pain you feel is from sprain in the surrounding tendons and ligaments of your foot.   The brace you have been given can be worn when you are going to be on your foot a lot. It can be taken off for bathing and at bedtime.  You may use Tylenol and/or Ibuprofen/Naproxen for pain relief and swelling. You may also use cold compresses for additional relief. Be sure to keep your foot elevated when you are resting. You may follow-up with your PCP if you continue to have issues for more than 4-6 weeks.  Enjoy your trip to [place]! Thank you for allowing [place] to take care of you today.

## 2018-03-16 NOTE — ED Triage Notes (Signed)
Reports left foot injury 4 days ago while walking down stairs.

## 2020-08-09 IMAGING — CR DG FOOT COMPLETE 3+V*L*
3 series · 3 of 3 positions shown · non-contrast
Comparison: None.

CLINICAL DATA: Foot injury 4 days ago.  Pain.

EXAM:
LEFT FOOT - COMPLETE 3+ VIEW

[t foot ap left]
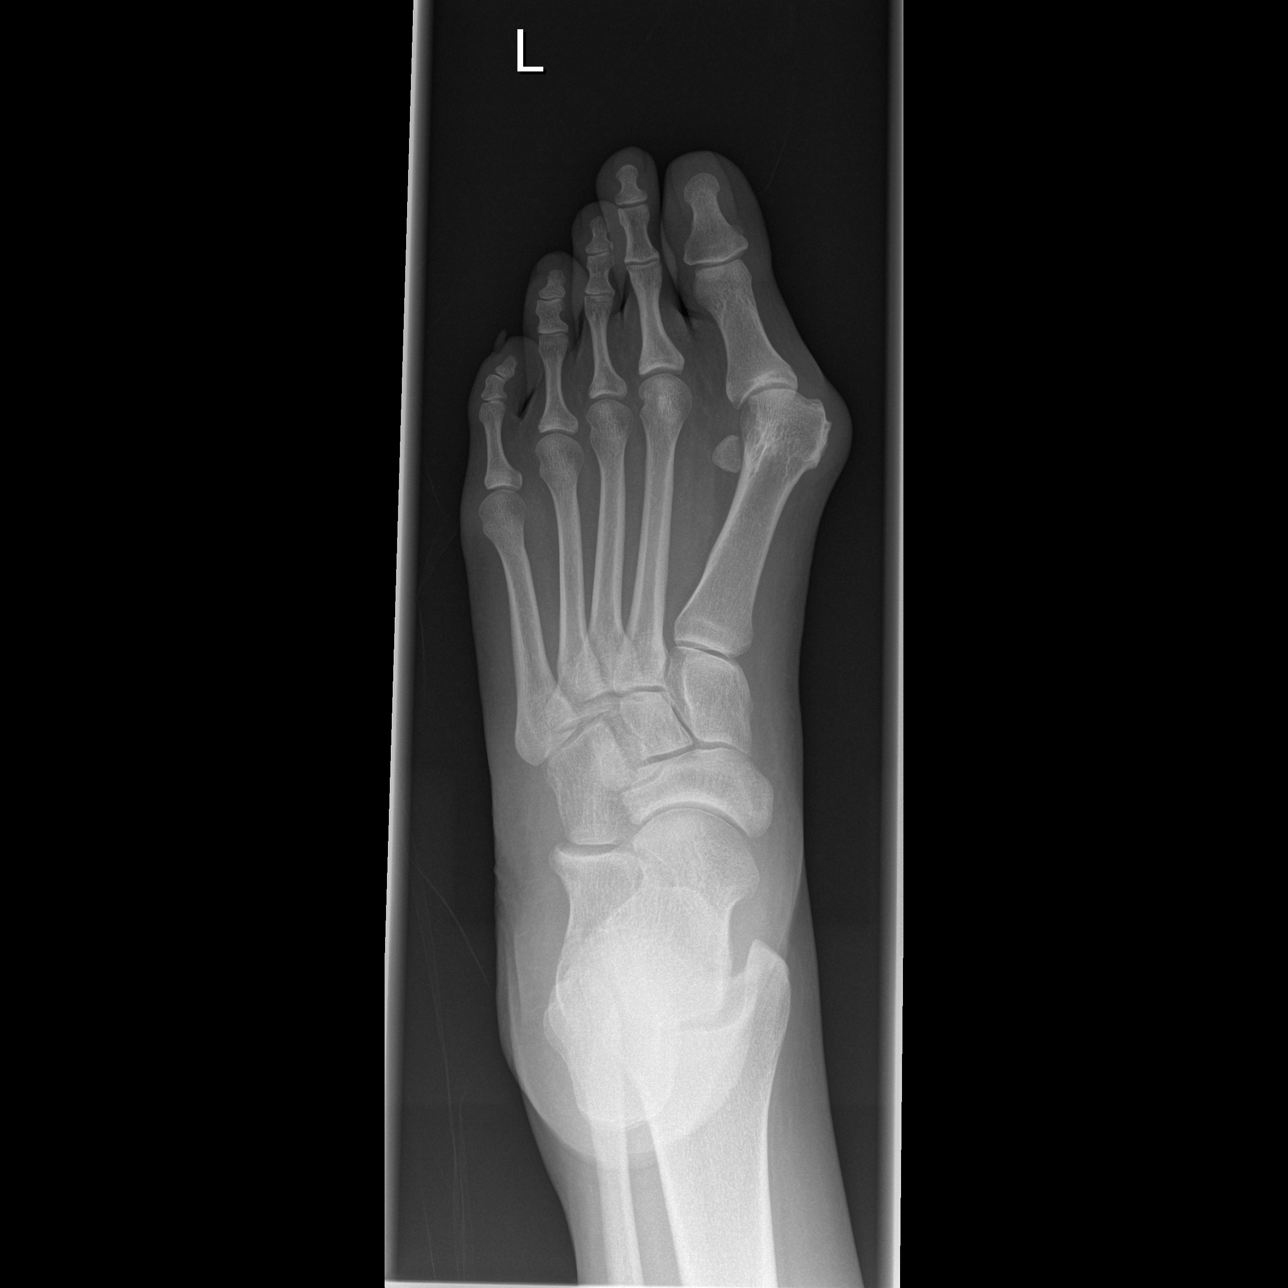

[t foot oblique left]
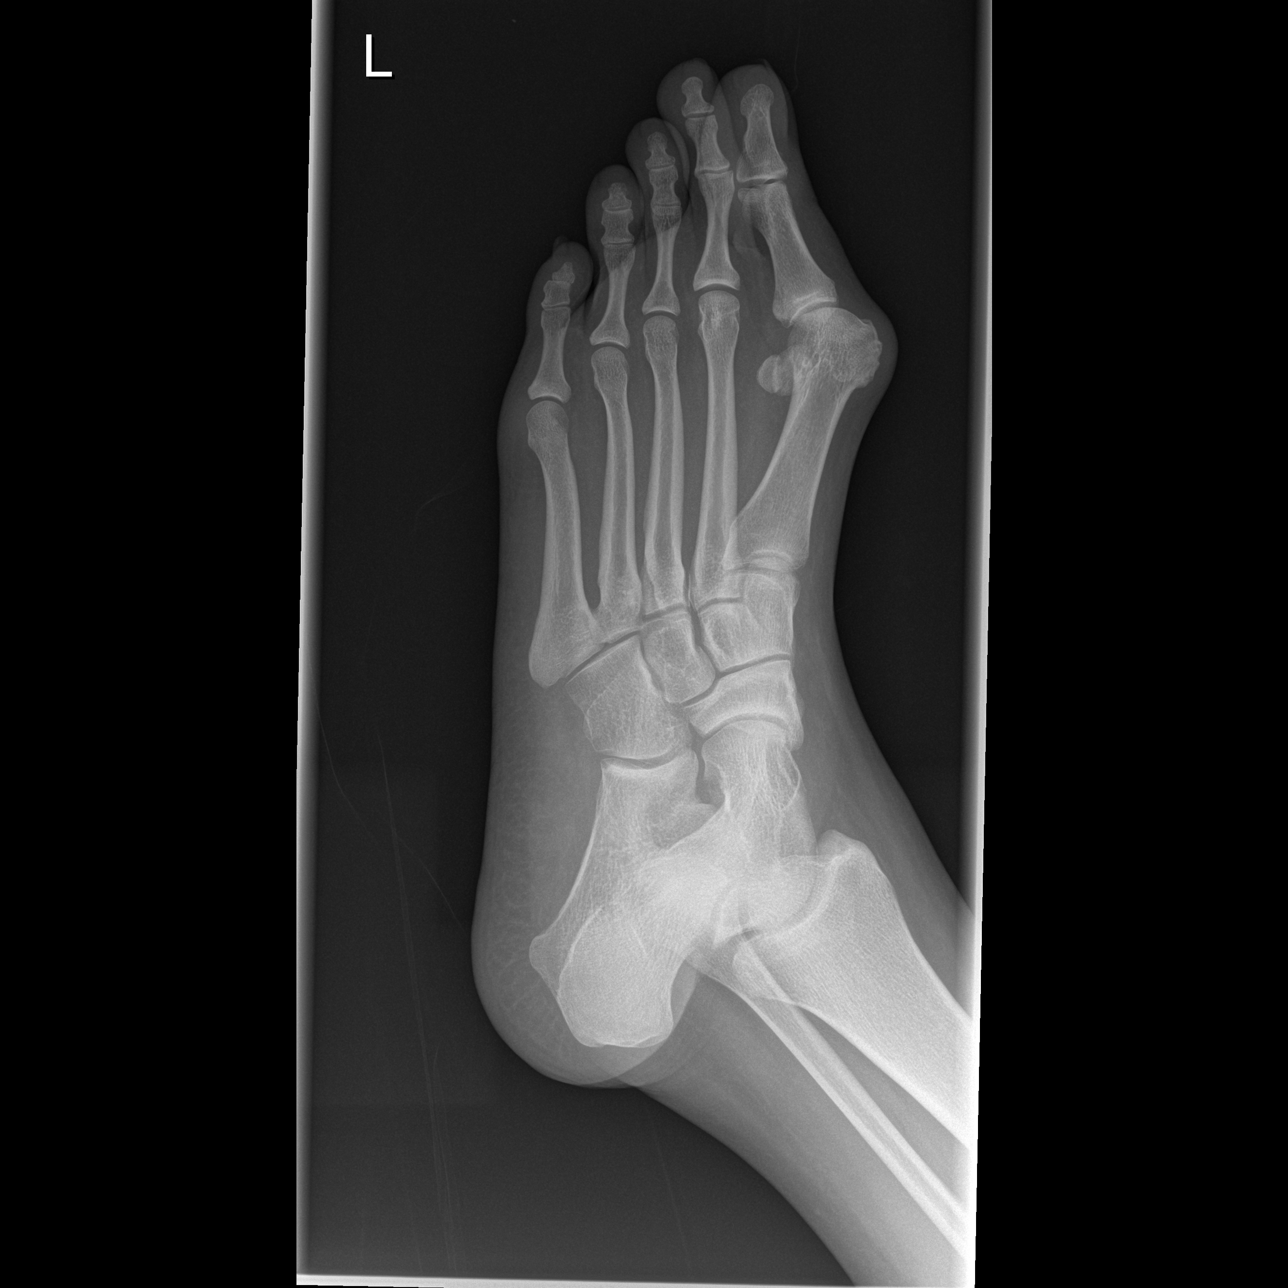

[t foot lat left]
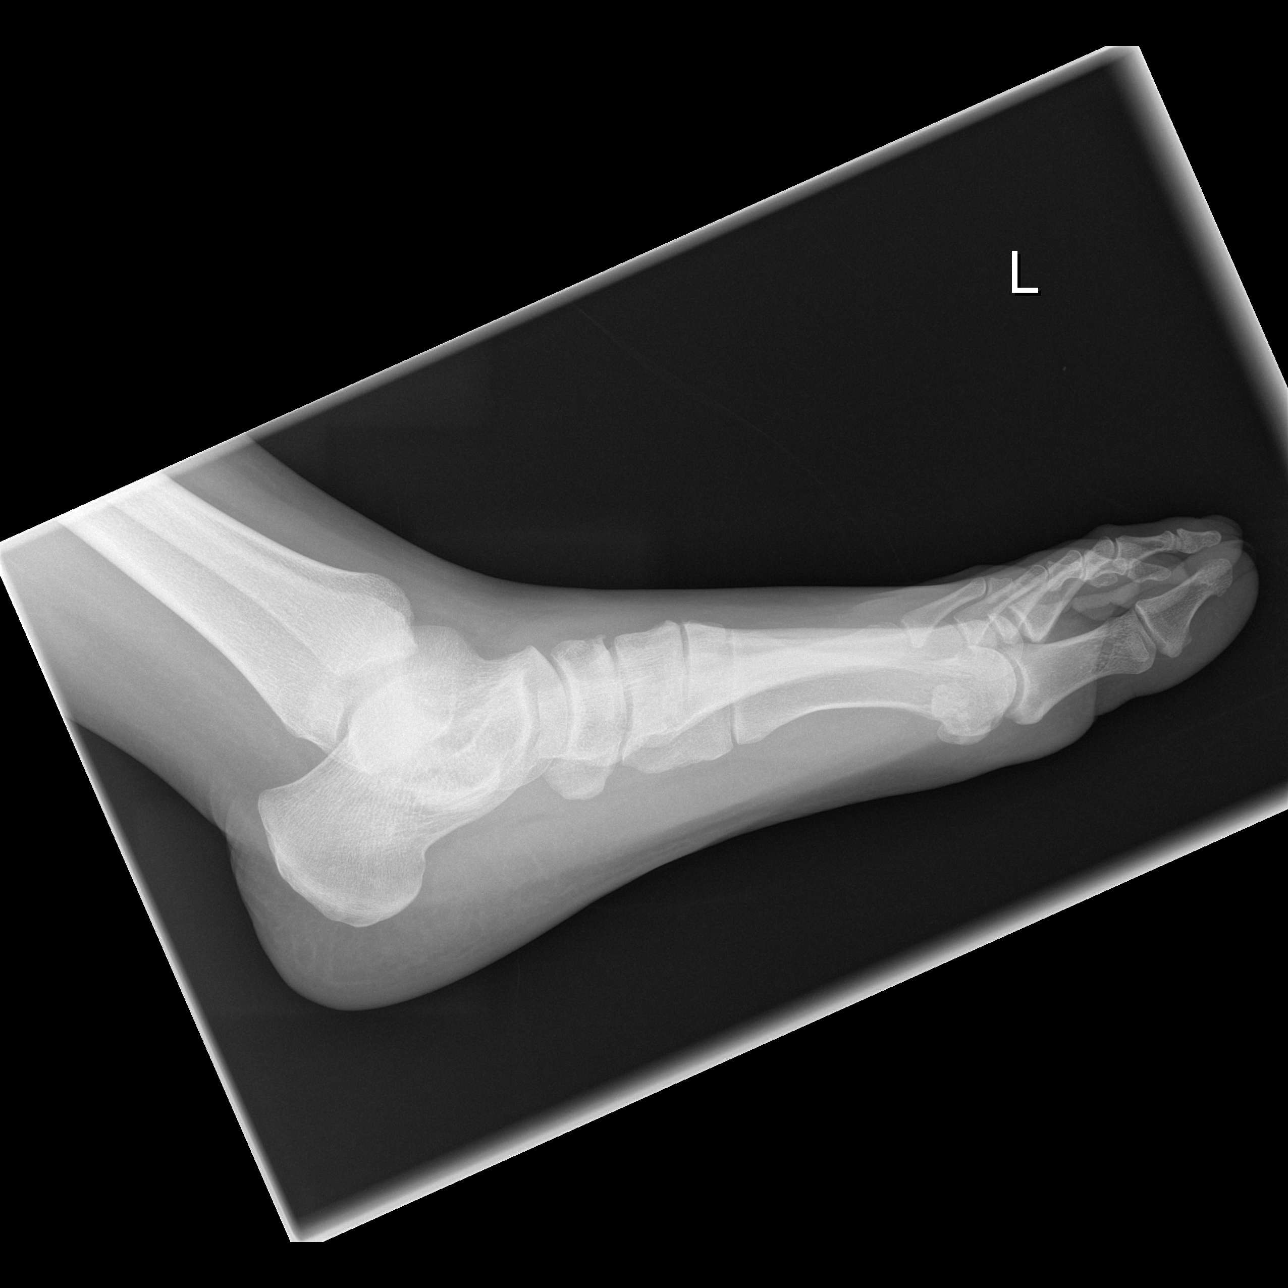

[3 of 3 positions shown; findings below may reference images not displayed]

FINDINGS: A hallux valgus deformity is identified.
IMPRESSION: Negative.

## 2024-06-18 ENCOUNTER — Emergency Department (HOSPITAL_BASED_OUTPATIENT_CLINIC_OR_DEPARTMENT_OTHER)

## 2024-06-18 ENCOUNTER — Emergency Department (HOSPITAL_BASED_OUTPATIENT_CLINIC_OR_DEPARTMENT_OTHER)
Admission: EM | Admit: 2024-06-18 | Discharge: 2024-06-18 | Disposition: A | Discharge status: Home / Self Care | Source: Home / Self Care | Attending: Emergency Medicine | Admitting: Emergency Medicine

## 2024-06-18 ENCOUNTER — Other Ambulatory Visit: Payer: Self-pay

## 2024-06-18 DIAGNOSIS — M25561 Pain in right knee: Secondary | ICD-10-CM

## 2024-06-18 DIAGNOSIS — M791 Myalgia, unspecified site: Secondary | ICD-10-CM

## 2024-06-18 DIAGNOSIS — W19XXXA Unspecified fall, initial encounter: Secondary | ICD-10-CM

## 2024-06-18 MED ORDER — NAPROXEN 500 MG PO TABS: 500.0000 mg | ORAL_TABLET | Freq: Two times a day (BID) | ORAL | 0 refills | Status: AC

## 2024-06-18 MED ORDER — METHOCARBAMOL 500 MG PO TABS
500.0000 mg | ORAL_TABLET | Freq: Once | ORAL | Status: AC
  Administered 2024-06-18: 500 mg via ORAL
  Filled 2024-06-18: qty 1

## 2024-06-18 MED ORDER — CYCLOBENZAPRINE HCL 5 MG PO TABS: 5.0000 mg | ORAL_TABLET | Freq: Three times a day (TID) | ORAL | 0 refills | Status: AC | PRN

## 2024-06-18 MED ORDER — KETOROLAC TROMETHAMINE 60 MG/2ML IM SOLN
30.0000 mg | Freq: Once | INTRAMUSCULAR | Status: AC
  Administered 2024-06-18: 30 mg via INTRAMUSCULAR
  Filled 2024-06-18: qty 2

## 2024-06-18 NOTE — ED Triage Notes (Signed)
 Pt presents via POV c/o fall on Tuesday due to ice. Reports bilateral knee, bilateral shoulder, back, and left sided neck pain. Ambulatory to triage. A&O x4. Denies LOC at time of fall. Reports fell forwards d/t ice.

## 2024-06-18 NOTE — ED Provider Notes (Signed)
 " [place] EMERGENCY DEPARTMENT AT [facility] Provider Note   CSN: [id number] Arrival date & time: 06/18/24  9980     Patient presents with: [place]   [name] is a 44 y.o. female.   44 year old female that had a fall on the ice Tuesday.  Had a l little bit of discomfort initially but when she woke up on Wednesday morning her knees hurt, legs hurt, lower back, left shoulder all hurt.  She stated when she fell she fell forward on her knees and put her hands out to stop her self.  Did not hit her head.  Can ambulate.   Fall       Prior to Admission medications  Medication Sig Start Date End Date Taking? Authorizing Provider  cyclobenzaprine  (FLEXERIL ) 5 MG tablet Take 1 tablet (5 mg total) by mouth 3 (three) times daily as needed for muscle spasms (muscle pain). 06/18/24  Yes Nila Winker, Selinda, MD  naproxen  (NAPROSYN ) 500 MG tablet Take 1 tablet (500 mg total) by mouth 2 (two) times daily. 06/18/24  Yes Taras Rask, Selinda, MD  ALPRAZolam (XANAX) 1 MG tablet Take by mouth. 08/27/17   [provider]  bacitracin  500 UNIT/GM ointment Apply 1 application topically 2 (two) times daily. 07/14/13   Trudy Earnie CROME, CNM  gabapentin (NEURONTIN) 300 MG capsule Take by mouth. 08/27/17   [provider]  ibuprofen  (ADVIL ,MOTRIN ) 600 MG tablet Take 1 tablet (600 mg total) by mouth every 6 (six) hours. 06/27/13   Adamo, Elena M, MD  Prenatal Vit-Fe Fumarate-FA (PRENATAL MULTIVITAMIN) TABS tablet Take 1 tablet by mouth daily at 12 noon. 06/29/13   Eveline Lynwood MATSU, MD  propranolol (INDERAL) 20 MG tablet Take by mouth. 08/27/17   [provider]    Allergies: Buspirone, Bupropion, Doxycycline hyclate, Other, and Venlafaxine    Review of Systems  Updated Vital Signs BP (!) 119/94 (BP Location: Left Arm)   Pulse 91   Temp 98.7 F (37.1 C)   Resp 18   LMP 06/16/2024 (Approximate)   SpO2 99%   Breastfeeding No   Physical Exam Vitals and nursing note reviewed.   Constitutional:      Appearance: She is well-developed.  HENT:     Head: Normocephalic and atraumatic.  Cardiovascular:     Rate and Rhythm: Normal rate and regular rhythm.  Pulmonary:     Effort: No respiratory distress.     Breath sounds: No stridor.  Abdominal:     General: There is no distension.  Musculoskeletal:     Cervical back: Normal range of motion.     Comments: Bilateral knee effusions  Neurological:     Mental Status: She is alert.     (all labs ordered are listed, but only abnormal results are displayed) Labs Reviewed - No data to display  EKG: None  Radiology: DG Knee Complete 4 Views Right Result Date: 06/18/2024 EXAM: 4 VIEW(S) XRAY OF THE RIGHT KNEE 06/18/2024 01:42:00 AM COMPARISON: None available. CLINICAL HISTORY: Evaluation for fracture. FINDINGS: BONES AND JOINTS: No acute fracture. No malalignment. No significant joint effusion. Superior patellar enthesophyte. Moderate tricompartmental degenerative changes. SOFT TISSUES: Unremarkable. IMPRESSION: 1. No acute fracture. Electronically signed by: Franky Stanford MD 06/18/2024 02:06 AM EST RP Workstation: [id number]   DG Knee Complete 4 Views Left Result Date: 06/18/2024 EXAM: 4 VIEW(S) XRAY OF THE LEFT KNEE 06/18/2024 01:42:00 AM COMPARISON: None available. CLINICAL HISTORY: Evaluate for fracture. FINDINGS: BONES AND JOINTS: No acute fracture. No malalignment. Small knee joint  effusion. Tricompartmental joint space narrowing, sclerosis and osteophytes consistent with degenerative joint disease. There are large patellar osteophytes, including a discontinuous lateral osteophyte. SOFT TISSUES: Unremarkable. IMPRESSION: 1. No acute fracture. 2. Small knee joint effusion. Electronically signed by: Franky Stanford MD 06/18/2024 02:04 AM EST RP Workstation: [id number]     Procedures   Medications Ordered in the ED  ketorolac  (TORADOL ) injection 30 mg (30 mg Intramuscular Given 06/18/24 0113)  methocarbamol  (ROBAXIN )  tablet 500 mg (500 mg Oral Given 06/18/24 0113)                                    Medical Decision Making Amount and/or Complexity of Data Reviewed Radiology: ordered.  Risk Prescription drug management.   X-rays of her knees showed effusions and chronic changes of likely arthritis.  No acute injuries.  I suspect the rest of her symptoms are more muscular.  Will treat symptomatically at home.  Final diagnoses:  Fall, initial encounter  Acute pain of both knees  Muscular pain    ED Discharge Orders          Ordered    naproxen  (NAPROSYN ) 500 MG tablet  2 times daily        06/18/24 0215    cyclobenzaprine  (FLEXERIL ) 5 MG tablet  3 times daily PRN        06/18/24 0215               Carrol Bondar, Selinda, MD 06/18/24 0457  "
